# Patient Record
Sex: Female | Born: 1995 | Race: White | Hispanic: No | Marital: Single | Smoking: Current every day smoker
Health system: Southern US, Community
[De-identification: ages and names within clinical notes are randomized; demographics above are authoritative.]

## PROBLEM LIST (undated history)

## (undated) HISTORY — PX: ADENOIDECTOMY: SUR15

---
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
## 2014-01-03 ENCOUNTER — Emergency Department (INDEPENDENT_AMBULATORY_CARE_PROVIDER_SITE_OTHER)
Admission: EM | Admit: 2014-01-03 | Discharge: 2014-01-03 | Disposition: A | Discharge status: Home / Self Care | Payer: Self-pay | Source: Home / Self Care | Attending: Family Medicine | Admitting: Family Medicine

## 2014-01-03 ENCOUNTER — Encounter (HOSPITAL_COMMUNITY): Payer: Self-pay | Admitting: Emergency Medicine

## 2014-01-03 DIAGNOSIS — L03011 Cellulitis of right finger: Secondary | ICD-10-CM

## 2014-01-03 DIAGNOSIS — IMO0002 Reserved for concepts with insufficient information to code with codable children: Secondary | ICD-10-CM

## 2014-01-03 MED ORDER — CEPHALEXIN 500 MG PO CAPS: 1000.0000 mg | ORAL_CAPSULE | Freq: Three times a day (TID) | ORAL | Status: DC

## 2014-01-03 NOTE — Discharge Instructions (Signed)

## 2014-01-03 NOTE — ED Provider Notes (Signed)
CSN: [id number]     Arrival date & time 01/03/14  1422 History   First MD Initiated Contact with Patient 01/03/14 1515     Chief Complaint  Patient presents with  . Hand Pain   (Consider location/radiation/quality/duration/timing/severity/associated sxs/prior Treatment) HPI Comments: 18 year old female presents for evaluation of pain and swelling with redness of her right middle finger for the past 3 days. This started on the lateral nail fold and the redness has spread around her finger. She tried soaking it but that did not help. She denies any drainage from the finger. The pain is rated as 6 or 7/10 at this time. She has never had this before. No injury to the finger. No systemic symptoms. She does chew on her fingernails.  Patient is a 18 y.o. female presenting with hand pain.  Hand Pain    History reviewed. No pertinent past medical history. History reviewed. No pertinent past surgical history. No family history on file. History  Substance Use Topics  . Smoking status: Current Every Day Smoker -- 1.00 packs/day    Types: Cigarettes  . Smokeless tobacco: Not on file  . Alcohol Use: No   OB History   Grav Para Term Preterm Abortions TAB SAB Ect Mult Living                 Review of Systems  Musculoskeletal:       See history of present illness  All other systems reviewed and are negative.   Allergies  Review of patient's allergies indicates no known allergies.  Home Medications   Prior to Admission medications   Medication Sig Start Date End Date Taking? Authorizing Provider  cephALEXin (KEFLEX) 500 MG capsule Take 2 capsules (1,000 mg total) by mouth 3 (three) times daily. 01/03/14   Adrian BlackwaterZachary H Ajeet Casasola, PA-C   BP 117/80  Pulse 87  Temp(Src) 99.2 F (37.3 C) (Oral)  Resp 18  SpO2 97% Physical Exam  Nursing note and vitals reviewed. Constitutional: She is oriented to person, place, and time. Vital signs are normal. She appears well-developed and well-nourished. No  distress.  HENT:  Head: Normocephalic and atraumatic.  Pulmonary/Chest: Effort normal. No respiratory distress.  Musculoskeletal:       Hands: Neurological: She is alert and oriented to person, place, and time. She has normal strength. Coordination normal.  Skin: Skin is warm and dry. No rash noted. She is not diaphoretic.  Psychiatric: She has a normal mood and affect. Judgment normal.    ED Course  Procedures (including critical care time) Labs Review Labs Reviewed  CULTURE, ROUTINE-ABSCESS    Imaging Review No results found.   MDM   1. Paronychia, right    Skin cleansed with alcohol, paronychia incised and drained with an 18-gauge single. Abscess culture obtained. Mild cellulitis present so treat with keflex. Warm soaks 3 times a day until this is better. Followup if no improvement.  Meds ordered this encounter  Medications  . cephALEXin (KEFLEX) 500 MG capsule    Sig: Take 2 capsules (1,000 mg total) by mouth 3 (three) times daily.    Dispense:  42 capsule    Refill:  0    Order Specific Question:  Supervising Provider    Answer:  Clementeen GrahamOREY, EVAN, S [[id number]]       Graylon GoodZachary H Elizabet Schweppe, PA-C 01/03/14 [address]1535  Marilene Vath H MaricaoBaker, PA-C 01/03/14 1536

## 2014-01-03 NOTE — ED Notes (Signed)
Pt c/o right middle finger pain around the nail bed x 3 days Sx include swelling, tender, localized fever, redness Denies inj/trauma Alert w/no signs of acute distress.

## 2014-01-04 NOTE — ED Provider Notes (Signed)
Medical screening examination/treatment/procedure(s) were performed by a resident physician or non-physician practitioner and as the supervising physician I was immediately available for consultation/collaboration.  Xiong Haidar, MD    Jenice Leiner S Nakeda Lebron, MD 01/04/14 0744 

## 2014-01-07 LAB — CULTURE, ROUTINE-ABSCESS

## 2014-03-19 ENCOUNTER — Emergency Department (HOSPITAL_COMMUNITY)
Admission: EM | Admit: 2014-03-19 | Discharge: 2014-03-19 | Disposition: A | Discharge status: Home / Self Care | Payer: Self-pay | Attending: Emergency Medicine | Admitting: Emergency Medicine

## 2014-03-19 ENCOUNTER — Encounter (HOSPITAL_COMMUNITY): Payer: Self-pay | Admitting: Emergency Medicine

## 2014-03-19 DIAGNOSIS — Z720 Tobacco use: Secondary | ICD-10-CM | POA: Insufficient documentation

## 2014-03-19 DIAGNOSIS — N12 Tubulo-interstitial nephritis, not specified as acute or chronic: Secondary | ICD-10-CM

## 2014-03-19 DIAGNOSIS — R112 Nausea with vomiting, unspecified: Secondary | ICD-10-CM

## 2014-03-19 DIAGNOSIS — Z3202 Encounter for pregnancy test, result negative: Secondary | ICD-10-CM | POA: Insufficient documentation

## 2014-03-19 DIAGNOSIS — B379 Candidiasis, unspecified: Secondary | ICD-10-CM

## 2014-03-19 DIAGNOSIS — R197 Diarrhea, unspecified: Secondary | ICD-10-CM

## 2014-03-19 LAB — COMPREHENSIVE METABOLIC PANEL
ALT: 11 U/L (ref 0–35)
ANION GAP: 19 — AB (ref 5–15)
AST: 16 U/L (ref 0–37)
Albumin: 5.2 g/dL (ref 3.5–5.2)
Alkaline Phosphatase: 96 U/L (ref 39–117)
BILIRUBIN TOTAL: 0.9 mg/dL (ref 0.3–1.2)
BUN: 12 mg/dL (ref 6–23)
CO2: 23 mEq/L (ref 19–32)
Calcium: 10.5 mg/dL (ref 8.4–10.5)
Chloride: 97 mEq/L (ref 96–112)
Creatinine, Ser: 0.83 mg/dL (ref 0.50–1.10)
GFR calc Af Amer: >90 mL/min (ref >90)
Glucose, Bld: 110 mg/dL — ABNORMAL HIGH (ref 70–99)
Potassium: 4 mEq/L (ref 3.7–5.3)
Sodium: 139 mEq/L (ref 137–147)
Total Protein: 9.1 g/dL — ABNORMAL HIGH (ref 6.0–8.3)

## 2014-03-19 LAB — CBC WITH DIFFERENTIAL/PLATELET
Basophils Absolute: 0 10*3/uL (ref 0.0–0.1)
Basophils Relative: 0 % (ref 0–1)
Eosinophils Absolute: 0.1 10*3/uL (ref 0.0–0.7)
Eosinophils Relative: 0 % (ref 0–5)
HEMATOCRIT: 48.1 % — AB (ref 36.0–46.0)
HEMOGLOBIN: 17.3 g/dL — AB (ref 12.0–15.0)
LYMPHS PCT: 18 % (ref 12–46)
Lymphs Abs: 3.1 10*3/uL (ref 0.7–4.0)
MCH: 30.1 pg (ref 26.0–34.0)
MCHC: 36 g/dL (ref 30.0–36.0)
MCV: 83.8 fL (ref 78.0–100.0)
MONO ABS: 1.2 10*3/uL — AB (ref 0.1–1.0)
MONOS PCT: 7 % (ref 3–12)
NEUTROS ABS: 12.3 10*3/uL — AB (ref 1.7–7.7)
Neutrophils Relative %: 75 % (ref 43–77)
Platelets: 414 10*3/uL — ABNORMAL HIGH (ref 150–400)
RBC: 5.74 MIL/uL — ABNORMAL HIGH (ref 3.87–5.11)
RDW: 13.1 % (ref 11.5–15.5)
WBC: 16.7 10*3/uL — AB (ref 4.0–10.5)

## 2014-03-19 LAB — WET PREP, GENITAL: Trich, Wet Prep: NONE SEEN

## 2014-03-19 LAB — URINE MICROSCOPIC-ADD ON

## 2014-03-19 LAB — URINALYSIS, ROUTINE W REFLEX MICROSCOPIC
Bilirubin Urine: NEGATIVE
GLUCOSE, UA: NEGATIVE mg/dL
KETONES UR: NEGATIVE mg/dL
NITRITE: NEGATIVE
PROTEIN: 100 mg/dL — AB
Specific Gravity, Urine: 1.028 (ref 1.005–1.030)
Urobilinogen, UA: 0.2 mg/dL (ref 0.0–1.0)
pH: 6.5 (ref 5.0–8.0)

## 2014-03-19 LAB — LIPASE, BLOOD: Lipase: 38 U/L (ref 11–59)

## 2014-03-19 LAB — PREGNANCY, URINE: Preg Test, Ur: NEGATIVE

## 2014-03-19 MED ORDER — CIPROFLOXACIN HCL 500 MG PO TABS
500.0000 mg | ORAL_TABLET | Freq: Once | ORAL | Status: AC
  Administered 2014-03-19: 500 mg via ORAL
  Filled 2014-03-19: qty 1

## 2014-03-19 MED ORDER — CIPROFLOXACIN HCL 500 MG PO TABS: 500.0000 mg | ORAL_TABLET | Freq: Two times a day (BID) | ORAL | Status: DC

## 2014-03-19 MED ORDER — ONDANSETRON 4 MG PO TBDP
4.0000 mg | ORAL_TABLET | Freq: Once | ORAL | Status: AC
  Administered 2014-03-19: 4 mg via ORAL
  Filled 2014-03-19: qty 1

## 2014-03-19 MED ORDER — SODIUM CHLORIDE 0.9 % IV BOLUS (SEPSIS)
1000.0000 mL | Freq: Once | INTRAVENOUS | Status: AC
  Administered 2014-03-19: 1000 mL via INTRAVENOUS

## 2014-03-19 MED ORDER — ONDANSETRON 4 MG PO TBDP
8.0000 mg | ORAL_TABLET | Freq: Once | ORAL | Status: AC
Start: 2014-03-19 — End: 2014-03-19
  Administered 2014-03-19: 8 mg via ORAL
  Filled 2014-03-19: qty 2

## 2014-03-19 MED ORDER — PROMETHAZINE HCL 25 MG RE SUPP: 25.0000 mg | Freq: Four times a day (QID) | RECTAL | Status: DC | PRN

## 2014-03-19 MED ORDER — FLUCONAZOLE 100 MG PO TABS
150.0000 mg | ORAL_TABLET | Freq: Once | ORAL | Status: AC
  Administered 2014-03-19: 150 mg via ORAL
  Filled 2014-03-19: qty 2

## 2014-03-19 MED ORDER — METRONIDAZOLE 500 MG PO TABS
500.0000 mg | ORAL_TABLET | Freq: Once | ORAL | Status: AC
  Administered 2014-03-19: 500 mg via ORAL
  Filled 2014-03-19: qty 1

## 2014-03-19 MED ORDER — METRONIDAZOLE 500 MG PO TABS: 500.0000 mg | ORAL_TABLET | Freq: Two times a day (BID) | ORAL | Status: DC

## 2014-03-19 MED ORDER — FENTANYL CITRATE 0.05 MG/ML IJ SOLN
50.0000 ug | Freq: Once | INTRAMUSCULAR | Status: AC
  Administered 2014-03-19: 50 ug via INTRAVENOUS
  Filled 2014-03-19: qty 2

## 2014-03-19 MED ORDER — PROMETHAZINE HCL 25 MG PO TABS: 25.0000 mg | ORAL_TABLET | Freq: Four times a day (QID) | ORAL | Status: DC | PRN

## 2014-03-19 NOTE — ED Notes (Signed)
Pt. reports persistent emesis with diarrhea, body aches , low grade fever and chills for several days .

## 2014-03-19 NOTE — ED Provider Notes (Signed)
CSN: [id number]     Arrival date & time 03/19/14  0240 History   First MD Initiated Contact with Patient 03/19/14 0413     Chief Complaint  Patient presents with  . Emesis  . Diarrhea  . Dehydration     (Consider location/radiation/quality/duration/timing/severity/associated sxs/prior Treatment) HPI Comments: Pt comes in with cc of 4 days of feeling ill. Pt has no medical hx, no surgical hx. Pt has lower quadrant pain, and is described as sharp pain, and intermittent. + n/v/diarrhea. Emesis x 10+,  Now bilious. + loose and watery BM x 4 times a day for the past 4 days as well. Pt also having myalgias, and 100.1 tmax at home. No sick contacts. Pt did have left over chicken few days ago, which was suspicious. No hx of abd surgeries. PT has some tingling with urination, no hematuria, polyuria. Pt has no hx of STD, no risk factors for the same and there is no hx of pelvic disorders.  Patient is a 18 y.o. female presenting with vomiting and diarrhea. The history is provided by the patient.  Emesis Associated symptoms: diarrhea   Associated symptoms: no abdominal pain and no headaches   Diarrhea Associated symptoms: fever and vomiting   Associated symptoms: no abdominal pain and no headaches     History reviewed. No pertinent past medical history. Past Surgical History  Procedure Laterality Date  . Adenoidectomy     No family history on file. History  Substance Use Topics  . Smoking status: Current Every Day Smoker -- 1.00 packs/day    Types: Cigarettes  . Smokeless tobacco: Not on file  . Alcohol Use: No   OB History   Grav Para Term Preterm Abortions TAB SAB Ect Mult Living                 Review of Systems  Constitutional: Positive for fever and activity change.  Respiratory: Negative for shortness of breath.   Cardiovascular: Negative for chest pain.  Gastrointestinal: Positive for nausea, vomiting and diarrhea. Negative for abdominal pain.  Genitourinary: Positive for  dysuria. Negative for frequency, hematuria, flank pain and vaginal discharge.  Musculoskeletal: Negative for neck pain.  Neurological: Negative for light-headedness and headaches.  All other systems reviewed and are negative.     Allergies  Review of patient's allergies indicates no known allergies.  Home Medications   Prior to Admission medications   Medication Sig Start Date End Date Taking? Authorizing Provider  acetaminophen (TYLENOL) 325 MG tablet Take 650 mg by mouth every 6 (six) hours as needed for fever.   Yes Historical Provider, MD   BP 114/66  Pulse 92  Temp(Src) 98.1 F (36.7 C) (Oral)  Resp 14  SpO2 98% Physical Exam  Nursing note and vitals reviewed. Constitutional: She is oriented to person, place, and time. She appears well-developed and well-nourished.  HENT:  Head: Normocephalic and atraumatic.  Eyes: Conjunctivae and EOM are normal. Pupils are equal, round, and reactive to light.  Neck: Normal range of motion. Neck supple.  Cardiovascular: Normal rate, regular rhythm, normal heart sounds and intact distal pulses.   No murmur heard. Pulmonary/Chest: Effort normal. No respiratory distress. She has no wheezes.  Abdominal: Soft. Bowel sounds are normal. She exhibits no distension. There is tenderness. There is no rebound and no guarding.  Mild tenderness with deep palpation - diffusely. Neg Mcburney's, neg murphy's. Tenderness is worst over the epigastrium and RLQ.  Genitourinary: Vagina normal and uterus normal.  External exam - normal,  no lesions Speculum exam: Pt has some white discharge, no blood Bimanual exam: Patient has no CMT, no adnexal tenderness or fullness and cervical os is closed  Neurological: She is alert and oriented to person, place, and time.  Skin: Skin is warm and dry.    ED Course  Procedures (including critical care time) Labs Review Labs Reviewed  WET PREP, GENITAL - Abnormal; Notable for the following:    Yeast Wet Prep HPF POC  FEW (*)    Clue Cells Wet Prep HPF POC FEW (*)    WBC, Wet Prep HPF POC TOO NUMEROUS TO COUNT (*)    All other components within normal limits  CBC WITH DIFFERENTIAL - Abnormal; Notable for the following:    WBC 16.7 (*)    RBC 5.74 (*)    Hemoglobin 17.3 (*)    HCT 48.1 (*)    Platelets 414 (*)    Neutro Abs 12.3 (*)    Monocytes Absolute 1.2 (*)    All other components within normal limits  COMPREHENSIVE METABOLIC PANEL - Abnormal; Notable for the following:    Glucose, Bld 110 (*)    Total Protein 9.1 (*)    Anion gap 19 (*)    All other components within normal limits  URINALYSIS, ROUTINE W REFLEX MICROSCOPIC - Abnormal; Notable for the following:    Color, Urine AMBER (*)    APPearance TURBID (*)    Hgb urine dipstick MODERATE (*)    Protein, ur 100 (*)    Leukocytes, UA LARGE (*)    All other components within normal limits  URINE MICROSCOPIC-ADD ON - Abnormal; Notable for the following:    Squamous Epithelial / LPF FEW (*)    Bacteria, UA FEW (*)    All other components within normal limits  GC/CHLAMYDIA PROBE AMP  PREGNANCY, URINE  LIPASE, BLOOD    Imaging Review No results found.   EKG Interpretation None      @6  am: Pt still has no focal RLQ tenderness, no Mcburney's. @7  am: Still no peritoneal signs. PO challenge started. Pt to get cipro and flagyl now.  MDM   Final diagnoses:  None    Pt comes in with 4 day hx of abd pain, nausea, emesis, diarrhea. Subjective fevers at home. Some dysuria. Pt has leukocytosis, UA has WBCS and GU exam is benign.  DDx includes: Pancreatitis Hepatobiliary pathology including cholecystitis Gastritis/PUD SBO Colitis Appendicitis Pyelonephritis UTI/Cystitis Ovarian cyst Ectopic pregnancy PID STD  Based on the hx and results in front of us, i would like to r/o appendicitis, and pelvic infection. No indication for imaging currently. Pt really has intermittent pain, and has no tenderness, unless i really palpate  deep into the peritoneum. We will get serial abd exam to assess for early appendicitis.  7:07 AM Serial abd exam neg for worsening abd pain, or peritonitis or focal findings. + Yeast. Oral challenge started. Return precautions for both cholelithiasis and appendicitis discussed. Pt also advised to return to the ER in 24 hours for repeat exam. Pt to come back if she is unable to keep meds down. Pt doesn't have insurance, asked her if she would like to stay back for case manager, however, she has been here all night, and wants to go home. She will be able to afford the antibiotics.  Derwood KaplanAnkit Aslan Himes, MD 03/19/14 [phone]

## 2014-03-19 NOTE — ED Notes (Signed)
Dr. Nanavanti at the bedside.  

## 2014-03-19 NOTE — ED Notes (Signed)
Ginger ale provided also for PO challenge.

## 2014-03-19 NOTE — ED Notes (Signed)
Given water for PO challenge 

## 2014-03-19 NOTE — Discharge Instructions (Signed)
We saw you in the ER for the nausea, vomiting, diarrhea, pain. As we discussed, abdominal exam is not suggestive of any surgical problems, however, if your symptoms worsen, we want you to come back to the ER immediately.  Read the instruction below. Come back to the ER in 24 hours for repeat exam.  TAKE THE ANTIBIOTICS as prescribed, and return to the ER if you unable to keep meds down.   Abdominal Pain, Women Abdominal (stomach, pelvic, or belly) pain can be caused by many things. It is important to tell your doctor:  The location of the pain.  Does it come and go or is it present all the time?  Are there things that start the pain (eating certain foods, exercise)?  Are there other symptoms associated with the pain (fever, nausea, vomiting, diarrhea)? All of this is helpful to know when trying to find the cause of the pain. CAUSES   Stomach: virus or bacteria infection, or ulcer.  Intestine: appendicitis (inflamed appendix), regional ileitis (Crohn's disease), ulcerative colitis (inflamed colon), irritable bowel syndrome, diverticulitis (inflamed diverticulum of the colon), or cancer of the stomach or intestine.  Gallbladder disease or stones in the gallbladder.  Kidney disease, kidney stones, or infection.  Pancreas infection or cancer.  Fibromyalgia (pain disorder).  Diseases of the female organs:  Uterus: fibroid (non-cancerous) tumors or infection.  Fallopian tubes: infection or tubal pregnancy.  Ovary: cysts or tumors.  Pelvic adhesions (scar tissue).  Endometriosis (uterus lining tissue growing in the pelvis and on the pelvic organs).  Pelvic congestion syndrome (female organs filling up with blood just before the menstrual period).  Pain with the menstrual period.  Pain with ovulation (producing an egg).  Pain with an IUD (intrauterine device, birth control) in the uterus.  Cancer of the female organs.  Functional pain (pain not caused by a disease, may  improve without treatment).  Psychological pain.  Depression. DIAGNOSIS  Your doctor will decide the seriousness of your pain by doing an examination.  Blood tests.  X-rays.  Ultrasound.  CT scan (computed tomography, special type of X-ray).  MRI (magnetic resonance imaging).  Cultures, for infection.  Barium enema (dye inserted in the large intestine, to better view it with X-rays).  Colonoscopy (looking in intestine with a lighted tube).  Laparoscopy (minor surgery, looking in abdomen with a lighted tube).  Major abdominal exploratory surgery (looking in abdomen with a large incision). TREATMENT  The treatment will depend on the cause of the pain.   Many cases can be observed and treated at home.  Over-the-counter medicines recommended by your caregiver.  Prescription medicine.  Antibiotics, for infection.  Birth control pills, for painful periods or for ovulation pain.  Hormone treatment, for endometriosis.  Nerve blocking injections.  Physical therapy.  Antidepressants.  Counseling with a psychologist or psychiatrist.  Minor or major surgery. HOME CARE INSTRUCTIONS   Do not take laxatives, unless directed by your caregiver.  Take over-the-counter pain medicine only if ordered by your caregiver. Do not take aspirin because it can cause an upset stomach or bleeding.  Try a clear liquid diet (broth or water) as ordered by your caregiver. Slowly move to a bland diet, as tolerated, if the pain is related to the stomach or intestine.  Have a thermometer and take your temperature several times a day, and record it.  Bed rest and sleep, if it helps the pain.  Avoid sexual intercourse, if it causes pain.  Avoid stressful situations.  Keep your  follow-up appointments and tests, as your caregiver orders.  If the pain does not go away with medicine or surgery, you may try:  Acupuncture.  Relaxation exercises (yoga, meditation).  Group  therapy.  Counseling. SEEK MEDICAL CARE IF:   You notice certain foods cause stomach pain.  Your home care treatment is not helping your pain.  You need stronger pain medicine.  You want your IUD removed.  You feel faint or lightheaded.  You develop nausea and vomiting.  You develop a rash.  You are having side effects or an allergy to your medicine. SEEK IMMEDIATE MEDICAL CARE IF:   Your pain does not go away or gets worse.  You have a fever.  Your pain is felt only in portions of the abdomen. The right side could possibly be appendicitis. The left lower portion of the abdomen could be colitis or diverticulitis.  You are passing blood in your stools (bright red or black tarry stools, with or without vomiting).  You have blood in your urine.  You develop chills, with or without a fever.  You pass out. MAKE SURE YOU:   Understand these instructions.  Will watch your condition.  Will get help right away if you are not doing well or get worse. Document Released: 03/21/2007 Document Revised: 10/08/2013 Document Reviewed: 04/10/2009 [facility] Patient Information 2015 [place], [place]. This information is not intended to replace advice given to you by your health care provider. Make sure you discuss any questions you have with your health care provider.   Appendicitis Appendicitis is when the appendix is swollen (inflamed). The inflammation can lead to developing a hole (perforation) and a collection of pus (abscess). CAUSES  There is not always an obvious cause of appendicitis. Sometimes it is caused by an obstruction in the appendix. The obstruction can be caused by:  A small, hard, pea-sized ball of stool (fecalith).  Enlarged lymph glands in the appendix. SYMPTOMS   Pain around your belly button (navel) that moves toward your lower right belly (abdomen). The pain can become more severe and sharp as time passes.  Tenderness in the lower right abdomen. Pain gets  worse if you cough or make a sudden movement.  Feeling sick to your stomach (nauseous).  Throwing up (vomiting).  Loss of appetite.  Fever.  Constipation.  Diarrhea.  Generally not feeling well. DIAGNOSIS   Physical exam.  Blood tests.  Urine test.  X-rays or a CT scan may confirm the diagnosis. TREATMENT  Once the diagnosis of appendicitis is made, the most common treatment is to remove the appendix as soon as possible. This procedure is called appendectomy. In an open appendectomy, a cut (incision) is made in the lower right abdomen and the appendix is removed. In a laparoscopic appendectomy, usually 3 small incisions are made. Long, thin instruments and a camera tube are used to remove the appendix. Most patients go home in 24 to 48 hours after appendectomy. In some situations, the appendix may have already perforated and an abscess may have formed. The abscess may have a "wall" around it as seen on a CT scan. In this case, a drain may be placed into the abscess to remove fluid, and you may be treated with antibiotic medicines that kill germs. The medicine is given through a tube in your vein (IV). Once the abscess has resolved, it may or may not be necessary to have an appendectomy. You may need to stay in the hospital longer than 48 hours. Document Released: 05/24/2005  Document Revised: 11/23/2011 Document Reviewed: 08/19/2009 [facility] Patient Information 2015 [place], [place]. This information is not intended to replace advice given to you by your health care provider. Make sure you discuss any questions you have with your health care provider. Cholelithiasis Cholelithiasis (also called gallstones) is a form of gallbladder disease in which gallstones form in your gallbladder. The gallbladder is an organ that stores bile made in the liver, which helps digest fats. Gallstones begin as small crystals and slowly grow into stones. Gallstone pain occurs when the gallbladder spasms and a  gallstone is blocking the duct. Pain can also occur when a stone passes out of the duct.  RISK FACTORS  Being female.   Having multiple pregnancies. Health care providers sometimes advise removing diseased gallbladders before future pregnancies.   Being obese.  Eating a diet heavy in fried foods and fat.   Being older than 60 years and increasing age.   Prolonged use of medicines containing female hormones.   Having diabetes mellitus.   Rapidly losing weight.   Having a family history of gallstones (heredity).  SYMPTOMS  Nausea.   Vomiting.  Abdominal pain.   Yellowing of the skin (jaundice).   Sudden pain. It may persist from several minutes to several hours.  Fever.   Tenderness to the touch. In some cases, when gallstones do not move into the bile duct, people have no pain or symptoms. These are called "silent" gallstones.  TREATMENT Silent gallstones do not need treatment. In severe cases, emergency surgery may be required. Options for treatment include:  Surgery to remove the gallbladder. This is the most common treatment.  Medicines. These do not always work and may take 6-12 months or more to work.  Shock wave treatment (extracorporeal biliary lithotripsy). In this treatment an ultrasound machine sends shock waves to the gallbladder to break gallstones into smaller pieces that can pass into the intestines or be dissolved by medicine. HOME CARE INSTRUCTIONS   Only take over-the-counter or prescription medicines for pain, discomfort, or fever as directed by your health care provider.   Follow a low-fat diet until seen again by your health care provider. Fat causes the gallbladder to contract, which can result in pain.   Follow up with your health care provider as directed. Attacks are almost always recurrent and surgery is usually required for permanent treatment.  SEEK IMMEDIATE MEDICAL CARE IF:   Your pain increases and is not controlled by  medicines.   You have a fever or persistent symptoms for more than 2-3 days.   You have a fever and your symptoms suddenly get worse.   You have persistent nausea and vomiting.  MAKE SURE YOU:   Understand these instructions.  Will watch your condition.  Will get help right away if you are not doing well or get worse. Document Released: 05/20/2005 Document Revised: 01/24/2013 Document Reviewed: 11/15/2012 [facility] Patient Information 2015 [place], [place]. This information is not intended to replace advice given to you by your health care provider. Make sure you discuss any questions you have with your health care provider.

## 2014-03-19 NOTE — ED Notes (Signed)
Pelvic cart is at the bedside. Patient given mouth swabs for comfort.

## 2014-03-19 NOTE — ED Notes (Signed)
Called main lab, wet prep takes 15 minutes to result.  Called Dr. Shyrl NumbersNanavanti to report this and that patient is requesting nausea medication. MD acknowledges and will enter medication.

## 2014-03-20 LAB — GC/CHLAMYDIA PROBE AMP
CT PROBE, AMP APTIMA: NEGATIVE
GC PROBE AMP APTIMA: NEGATIVE

## 2014-03-21 ENCOUNTER — Encounter (HOSPITAL_COMMUNITY): Payer: Self-pay | Admitting: Emergency Medicine

## 2014-03-21 ENCOUNTER — Emergency Department (HOSPITAL_COMMUNITY)
Admission: EM | Admit: 2014-03-21 | Discharge: 2014-03-21 | Disposition: A | Discharge status: Home / Self Care | Payer: Self-pay | Attending: Emergency Medicine | Admitting: Emergency Medicine

## 2014-03-21 ENCOUNTER — Emergency Department (HOSPITAL_COMMUNITY)
Admission: EM | Admit: 2014-03-21 | Discharge: 2014-03-21 | Discharge status: Against Medical Advice | Payer: Self-pay | Attending: Emergency Medicine | Admitting: Emergency Medicine

## 2014-03-21 DIAGNOSIS — Z792 Long term (current) use of antibiotics: Secondary | ICD-10-CM | POA: Insufficient documentation

## 2014-03-21 DIAGNOSIS — Z720 Tobacco use: Secondary | ICD-10-CM | POA: Insufficient documentation

## 2014-03-21 DIAGNOSIS — R630 Anorexia: Secondary | ICD-10-CM | POA: Insufficient documentation

## 2014-03-21 DIAGNOSIS — R1110 Vomiting, unspecified: Secondary | ICD-10-CM | POA: Insufficient documentation

## 2014-03-21 DIAGNOSIS — Z3202 Encounter for pregnancy test, result negative: Secondary | ICD-10-CM | POA: Insufficient documentation

## 2014-03-21 DIAGNOSIS — R5383 Other fatigue: Secondary | ICD-10-CM | POA: Insufficient documentation

## 2014-03-21 DIAGNOSIS — M545 Low back pain: Secondary | ICD-10-CM | POA: Insufficient documentation

## 2014-03-21 DIAGNOSIS — R112 Nausea with vomiting, unspecified: Secondary | ICD-10-CM | POA: Insufficient documentation

## 2014-03-21 DIAGNOSIS — R109 Unspecified abdominal pain: Secondary | ICD-10-CM | POA: Insufficient documentation

## 2014-03-21 LAB — COMPREHENSIVE METABOLIC PANEL
ALT: 10 U/L (ref 0–35)
AST: 13 U/L (ref 0–37)
Albumin: 4.9 g/dL (ref 3.5–5.2)
Alkaline Phosphatase: 81 U/L (ref 39–117)
Anion gap: 18 — ABNORMAL HIGH (ref 5–15)
BUN: 11 mg/dL (ref 6–23)
CALCIUM: 10.2 mg/dL (ref 8.4–10.5)
CO2: 22 meq/L (ref 19–32)
CREATININE: 0.77 mg/dL (ref 0.50–1.10)
Chloride: 101 mEq/L (ref 96–112)
Glucose, Bld: 87 mg/dL (ref 70–99)
Potassium: 4.1 mEq/L (ref 3.7–5.3)
Sodium: 141 mEq/L (ref 137–147)
Total Bilirubin: 0.8 mg/dL (ref 0.3–1.2)
Total Protein: 8.5 g/dL — ABNORMAL HIGH (ref 6.0–8.3)

## 2014-03-21 LAB — URINALYSIS, ROUTINE W REFLEX MICROSCOPIC
Glucose, UA: NEGATIVE mg/dL
HGB URINE DIPSTICK: NEGATIVE
KETONES UR: 40 mg/dL — AB
Nitrite: NEGATIVE
PROTEIN: NEGATIVE mg/dL
Specific Gravity, Urine: 1.024 (ref 1.005–1.030)
UROBILINOGEN UA: 0.2 mg/dL (ref 0.0–1.0)
pH: 6 (ref 5.0–8.0)

## 2014-03-21 LAB — CBC WITH DIFFERENTIAL/PLATELET
BASOS PCT: 0 % (ref 0–1)
Basophils Absolute: 0 10*3/uL (ref 0.0–0.1)
EOS ABS: 0 10*3/uL (ref 0.0–0.7)
EOS PCT: 0 % (ref 0–5)
HCT: 45.6 % (ref 36.0–46.0)
HEMOGLOBIN: 16.1 g/dL — AB (ref 12.0–15.0)
Lymphocytes Relative: 28 % (ref 12–46)
Lymphs Abs: 2.7 10*3/uL (ref 0.7–4.0)
MCH: 29.8 pg (ref 26.0–34.0)
MCHC: 35.3 g/dL (ref 30.0–36.0)
MCV: 84.4 fL (ref 78.0–100.0)
MONOS PCT: 6 % (ref 3–12)
Monocytes Absolute: 0.6 10*3/uL (ref 0.1–1.0)
NEUTROS PCT: 66 % (ref 43–77)
Neutro Abs: 6.2 10*3/uL (ref 1.7–7.7)
Platelets: 340 10*3/uL (ref 150–400)
RBC: 5.4 MIL/uL — ABNORMAL HIGH (ref 3.87–5.11)
RDW: 13 % (ref 11.5–15.5)
WBC: 9.5 10*3/uL (ref 4.0–10.5)

## 2014-03-21 LAB — URINE MICROSCOPIC-ADD ON

## 2014-03-21 LAB — PREGNANCY, URINE: Preg Test, Ur: NEGATIVE

## 2014-03-21 LAB — LIPASE, BLOOD: LIPASE: 24 U/L (ref 11–59)

## 2014-03-21 MED ORDER — ONDANSETRON 8 MG PO TBDP
8.0000 mg | ORAL_TABLET | Freq: Once | ORAL | Status: AC
  Administered 2014-03-21: 8 mg via ORAL
  Filled 2014-03-21: qty 1

## 2014-03-21 MED ORDER — SODIUM CHLORIDE 0.9 % IV BOLUS (SEPSIS)
1000.0000 mL | Freq: Once | INTRAVENOUS | Status: AC
  Administered 2014-03-21: 1000 mL via INTRAVENOUS

## 2014-03-21 MED ORDER — SODIUM CHLORIDE 0.9 % IV BOLUS (SEPSIS): 1000.0000 mL | Freq: Once | INTRAVENOUS | Status: DC

## 2014-03-21 MED ORDER — ONDANSETRON HCL 4 MG/2ML IJ SOLN
4.0000 mg | Freq: Once | INTRAMUSCULAR | Status: AC
  Administered 2014-03-21: 4 mg via INTRAVENOUS
  Filled 2014-03-21: qty 2

## 2014-03-21 MED ORDER — ONDANSETRON 8 MG PO TBDP: 8.0000 mg | ORAL_TABLET | Freq: Three times a day (TID) | ORAL | Status: DC | PRN

## 2014-03-21 NOTE — ED Notes (Signed)
Patient states was here 2 days ago for same.  She states "the doctor just pressed on my stomach and sent me home with antibiotics".  Patient states that she was also told "it was gallbladder or appendicitis.  Sent me home with stuff to look for".   Patient claims that her birthcontrol implant is expired and that could be what is making her so sick.   Patient states there is a possibility (low) that she could be pregnant.

## 2014-03-21 NOTE — ED Provider Notes (Signed)
CSN: [id number]     Arrival date & time 03/21/14  1803 History   First MD Initiated Contact with Patient 03/21/14 2033     Chief Complaint  Patient presents with  . Nausea  . Emesis  . Back Pain     (Consider location/radiation/quality/duration/timing/severity/associated sxs/prior Treatment) Patient is a 18 y.o. female presenting with vomiting and back pain. The history is provided by the patient.  Emesis Severity:  Moderate Associated symptoms: no abdominal pain, no diarrhea and no headaches   Back Pain Associated symptoms: no abdominal pain, no chest pain, no headaches, no numbness and no weakness    patient has had nausea and vomiting for the last 5-6 days. She was seen in the ER for the same 2 days ago. She had some chills. She ate some chicken that she was not sure was still good previously. She initially had a little loose stool, but that has resolved. She has some lower back pain. She had some chills with that also was called. She's had a decreased appetite. She states that she was given antibiotics on the last visit. She states Phenergan has not helped. She has had oral and rectal Phenergan  History reviewed. No pertinent past medical history. Past Surgical History  Procedure Laterality Date  . Adenoidectomy     No family history on file. History  Substance Use Topics  . Smoking status: Current Every Day Smoker -- 1.00 packs/day    Types: Cigarettes  . Smokeless tobacco: Not on file  . Alcohol Use: Yes     Comment: socially   OB History   Grav Para Term Preterm Abortions TAB SAB Ect Mult Living                 Review of Systems  Constitutional: Positive for appetite change and fatigue. Negative for activity change.  Eyes: Negative for pain.  Respiratory: Negative for chest tightness and shortness of breath.   Cardiovascular: Negative for chest pain and leg swelling.  Gastrointestinal: Positive for nausea and vomiting. Negative for abdominal pain and diarrhea.   Genitourinary: Negative for flank pain.  Musculoskeletal: Positive for back pain. Negative for neck stiffness.  Skin: Negative for rash.  Neurological: Negative for weakness, numbness and headaches.  Psychiatric/Behavioral: Negative for behavioral problems.      Allergies  Food  Home Medications   Prior to Admission medications   Medication Sig Start Date End Date Taking? Authorizing Provider  ciprofloxacin (CIPRO) 500 MG tablet Take 1 tablet (500 mg total) by mouth 2 (two) times daily. 03/19/14  Yes Derwood KaplanAnkit Nanavati, MD  metroNIDAZOLE (FLAGYL) 500 MG tablet Take 1 tablet (500 mg total) by mouth 2 (two) times daily. 03/19/14  Yes Derwood KaplanAnkit Nanavati, MD  Multiple Vitamin (MULTIVITAMIN WITH MINERALS) TABS tablet Take 1 tablet by mouth daily.   Yes Historical Provider, MD  promethazine (PHENERGAN) 25 MG suppository Place 1 suppository (25 mg total) rectally every 6 (six) hours as needed for nausea. 03/19/14  Yes Derwood KaplanAnkit Nanavati, MD  promethazine (PHENERGAN) 25 MG tablet Take 1 tablet (25 mg total) by mouth every 6 (six) hours as needed for nausea. 03/19/14  Yes Ankit Rhunette CroftNanavati, MD  ondansetron (ZOFRAN-ODT) 8 MG disintegrating tablet Take 1 tablet (8 mg total) by mouth every 8 (eight) hours as needed for nausea or vomiting. 03/21/14   Juliet RudeNathan R. Jarissa Sheriff, MD   Pulse 106  Temp(Src) 98.5 F (36.9 C) (Oral)  Resp 20  SpO2 100% Physical Exam  Constitutional: She is oriented to person, place,  and time. She appears well-developed and well-nourished.  HENT:  Head: Normocephalic and atraumatic.  Cardiovascular: Normal rate and regular rhythm.   Pulmonary/Chest: Effort normal and breath sounds normal.  Abdominal: Soft. Bowel sounds are normal. There is no tenderness. There is no rebound and no guarding.  Musculoskeletal: She exhibits tenderness.  Mild lower back tenderness without step-off or deformity. No rash.  Neurological: She is alert and oriented to person, place, and time.  Skin: Skin is  warm.  Psychiatric: She has a normal mood and affect.    ED Course  Procedures (including critical care time) Labs Review Labs Reviewed  CBC WITH DIFFERENTIAL - Abnormal; Notable for the following:    RBC 5.40 (*)    Hemoglobin 16.1 (*)    All other components within normal limits  URINALYSIS, ROUTINE W REFLEX MICROSCOPIC - Abnormal; Notable for the following:    APPearance CLOUDY (*)    Bilirubin Urine SMALL (*)    Ketones, ur 40 (*)    Leukocytes, UA SMALL (*)    All other components within normal limits  COMPREHENSIVE METABOLIC PANEL - Abnormal; Notable for the following:    Total Protein 8.5 (*)    Anion gap 18 (*)    All other components within normal limits  URINE MICROSCOPIC-ADD ON - Abnormal; Notable for the following:    Squamous Epithelial / LPF MANY (*)    All other components within normal limits  URINE CULTURE  PREGNANCY, URINE  LIPASE, BLOOD    Imaging Review No results found.   EKG Interpretation None      MDM   Final diagnoses:  Non-intractable vomiting with nausea, vomiting of unspecified type     patient with nausea vomiting. Could be due to food. No urinary tract infection now and culture was sent due to possible infection before. Previous records reviewed. She does have 40 ketones in the urine. Feels better after IV fluids and Zofran. She's tolerated orals and is eager to be discharged home.     Juliet RudeNathan R. Rubin PayorPickering, MD 03/21/14 2318

## 2014-03-21 NOTE — Discharge Instructions (Signed)

## 2014-03-21 NOTE — ED Notes (Signed)
PT provided with Sprite

## 2014-03-21 NOTE — ED Notes (Signed)
Pt states that she was at [facility] today for 6 hours and was not seen.  Was at [facility] 2 days before that for same.  States she has been vomiting x 1 wk w/ low back pain.

## 2014-03-22 LAB — URINE CULTURE
Colony Count: NO GROWTH
Culture: NO GROWTH

## 2016-07-07 ENCOUNTER — Encounter (HOSPITAL_COMMUNITY): Payer: Self-pay | Admitting: Emergency Medicine

## 2016-07-07 DIAGNOSIS — F17210 Nicotine dependence, cigarettes, uncomplicated: Secondary | ICD-10-CM | POA: Insufficient documentation

## 2016-07-07 DIAGNOSIS — K0520 Aggressive periodontitis, unspecified: Secondary | ICD-10-CM | POA: Insufficient documentation

## 2016-07-07 DIAGNOSIS — K011 Impacted teeth: Secondary | ICD-10-CM | POA: Insufficient documentation

## 2016-07-07 NOTE — ED Triage Notes (Signed)
Pt c/o right lower jaw pain from wisdom teeth.

## 2016-07-08 ENCOUNTER — Emergency Department (HOSPITAL_COMMUNITY)
Admission: EM | Admit: 2016-07-08 | Discharge: 2016-07-08 | Disposition: A | Discharge status: Home / Self Care | Payer: Self-pay | Attending: Emergency Medicine | Admitting: Emergency Medicine

## 2016-07-08 DIAGNOSIS — K011 Impacted teeth: Secondary | ICD-10-CM

## 2016-07-08 DIAGNOSIS — K0520 Aggressive periodontitis, unspecified: Secondary | ICD-10-CM

## 2016-07-08 MED ORDER — PENICILLIN V POTASSIUM 500 MG PO TABS: 500.0000 mg | ORAL_TABLET | Freq: Four times a day (QID) | ORAL | 0 refills | Status: AC

## 2016-07-08 MED ORDER — KETOROLAC TROMETHAMINE 60 MG/2ML IM SOLN
60.0000 mg | Freq: Once | INTRAMUSCULAR | Status: AC
  Administered 2016-07-08: 60 mg via INTRAMUSCULAR
  Filled 2016-07-08: qty 2

## 2016-07-08 MED ORDER — PENICILLIN V POTASSIUM 250 MG PO TABS
500.0000 mg | ORAL_TABLET | Freq: Once | ORAL | Status: AC
  Administered 2016-07-08: 500 mg via ORAL
  Filled 2016-07-08: qty 2

## 2016-07-08 NOTE — Discharge Instructions (Signed)
Gargle with warm salt water for comfort. Take the antibiotics until gone. Take ibuprofen 600 mg + acetaminophen 1000 mg 4 times a day for pain. You will need to see a dentist to get definitive treatment of your problem.

## 2016-07-08 NOTE — ED Provider Notes (Signed)
[facility] Provider Note   CSN: [id number] Arrival date & time: 07/07/16  2318  Time seen 02:07 AM   History   Chief Complaint Chief Complaint  Patient presents with  . Dental Pain    HPI SHARIFAH CHAMPINE is a 21 y.o. female.  HPI Patient states her upper wisdom teeth started bothering her to 3 months ago. She states her lower wisdom teeth have been hurting for about the last 2 weeks. She states it got worse tonight about 10 PM especially on the right lower wisdom tooth. She states she took Tylenol without relief. She states it's making her have headaches. She denies any fever. She states she feels like when she is drinking she's not drinking as much fluid as usual. She denies any difficulty breathing. She states she doesn't have a dentist.  PCP none    History reviewed. No pertinent past medical history.  There are no active problems to display for this patient.   Past Surgical History:  Procedure Laterality Date  . ADENOIDECTOMY      OB History    No data available       Home Medications    Prior to Admission medications   Medication Sig Start Date End Date Taking? Authorizing Provider  ciprofloxacin (CIPRO) 500 MG tablet Take 1 tablet (500 mg total) by mouth 2 (two) times daily. 03/19/14   Derwood Kaplan, MD  metroNIDAZOLE (FLAGYL) 500 MG tablet Take 1 tablet (500 mg total) by mouth 2 (two) times daily. 03/19/14   Derwood Kaplan, MD  Multiple Vitamin (MULTIVITAMIN WITH MINERALS) TABS tablet Take 1 tablet by mouth daily.    Historical Provider, MD  ondansetron (ZOFRAN-ODT) 8 MG disintegrating tablet Take 1 tablet (8 mg total) by mouth every 8 (eight) hours as needed for nausea or vomiting. 03/21/14   Benjiman Core, MD  penicillin v potassium (VEETID) 500 MG tablet Take 1 tablet (500 mg total) by mouth 4 (four) times daily. 07/08/16 07/15/16  Devoria Albe, MD  promethazine (PHENERGAN) 25 MG suppository Place 1 suppository (25 mg total) rectally every 6 (six) hours  as needed for nausea. 03/19/14   Derwood Kaplan, MD  promethazine (PHENERGAN) 25 MG tablet Take 1 tablet (25 mg total) by mouth every 6 (six) hours as needed for nausea. 03/19/14   Derwood Kaplan, MD    Family History History reviewed. No pertinent family history.  Social History Social History  Substance Use Topics  . Smoking status: Current Every Day Smoker    Packs/day: 1.00    Types: Cigarettes  . Smokeless tobacco: Never Used  . Alcohol use Yes     Comment: socially  employed at [employer] 1/2 ppd   Allergies   Food   Review of Systems Review of Systems  All other systems reviewed and are negative.    Physical Exam Updated Vital Signs BP 136/93   Pulse 75   Temp 98.2 F (36.8 C)   Resp 18   Ht 5\' 1"  (1.549 m)   Wt 150 lb (68 kg)   LMP 07/05/2016   SpO2 100%   BMI 28.34 kg/m   Vital signs normal    Physical Exam  Constitutional: She is oriented to person, place, and time. She appears well-developed and well-nourished. She appears distressed.  HENT:  Head: Normocephalic and atraumatic.  Right Ear: External ear normal.  Left Ear: External ear normal.  Mouth/Throat: Oropharynx is clear and moist.  When I inspected the area of her wisdom teeth the teeth  are not visible. They are are still below the gumline. She is very tender to palpation over the gum of the right lower wisdom tooth. There is no facial swelling seen there is no soft palate swelling seen the uvula is midline. Her speech is normal.  Eyes: Conjunctivae and EOM are normal.  Neck: Normal range of motion.  Cardiovascular: Normal rate.   Pulmonary/Chest: Effort normal. No respiratory distress.  Musculoskeletal: Normal range of motion.  Neurological: She is alert and oriented to person, place, and time. No cranial nerve deficit.  Skin: Skin is warm and dry.  Psychiatric: She has a normal mood and affect. Her behavior is normal. Thought content normal.     ED Treatments / Results    Procedures Procedures (including critical care time)  Medications Ordered in ED Medications  penicillin v potassium (VEETID) tablet 500 mg (not administered)  ketorolac (TORADOL) injection 60 mg (60 mg Intramuscular Given 07/08/16 0220)     Initial Impression / Assessment and Plan / ED Course  I have reviewed the triage vital signs and the nursing notes.  Pertinent labs & imaging results that were available during my care of the patient were reviewed by me and considered in my medical decision making (see chart for details).   patient was given Toradol for pain. She was started on penicillin 500 mg 4 times a day. I suspect she has pericoronitis. We discussed she needs to see a dentist for definitive care of her dental problem. She can take acetaminophen and ibuprofen for her pain.  Final Clinical Impressions(s) / ED Diagnoses   Final diagnoses:  Acute pericoronitis  Impacted tooth    New Prescriptions New Prescriptions   PENICILLIN V POTASSIUM (VEETID) 500 MG TABLET    Take 1 tablet (500 mg total) by mouth 4 (four) times daily.    Plan discharge  Devoria AlbeIva Nehal Witting, MD, Concha PyoFACEP    Averianna Brugger, MD 07/08/16 Emeline [id number]

## 2016-11-07 ENCOUNTER — Encounter (HOSPITAL_COMMUNITY): Payer: Self-pay | Admitting: Emergency Medicine

## 2016-11-07 ENCOUNTER — Emergency Department (HOSPITAL_COMMUNITY)
Admission: EM | Admit: 2016-11-07 | Discharge: 2016-11-07 | Disposition: A | Discharge status: Home / Self Care | Payer: Self-pay | Attending: Emergency Medicine | Admitting: Emergency Medicine

## 2016-11-07 DIAGNOSIS — G44209 Tension-type headache, unspecified, not intractable: Secondary | ICD-10-CM | POA: Insufficient documentation

## 2016-11-07 DIAGNOSIS — Z79899 Other long term (current) drug therapy: Secondary | ICD-10-CM | POA: Insufficient documentation

## 2016-11-07 DIAGNOSIS — S80261A Insect bite (nonvenomous), right knee, initial encounter: Secondary | ICD-10-CM | POA: Insufficient documentation

## 2016-11-07 DIAGNOSIS — Y999 Unspecified external cause status: Secondary | ICD-10-CM | POA: Insufficient documentation

## 2016-11-07 DIAGNOSIS — Y929 Unspecified place or not applicable: Secondary | ICD-10-CM | POA: Insufficient documentation

## 2016-11-07 DIAGNOSIS — W57XXXA Bitten or stung by nonvenomous insect and other nonvenomous arthropods, initial encounter: Secondary | ICD-10-CM | POA: Insufficient documentation

## 2016-11-07 DIAGNOSIS — S80262A Insect bite (nonvenomous), left knee, initial encounter: Secondary | ICD-10-CM | POA: Insufficient documentation

## 2016-11-07 DIAGNOSIS — Y939 Activity, unspecified: Secondary | ICD-10-CM | POA: Insufficient documentation

## 2016-11-07 DIAGNOSIS — F17210 Nicotine dependence, cigarettes, uncomplicated: Secondary | ICD-10-CM | POA: Insufficient documentation

## 2016-11-07 MED ORDER — DOXYCYCLINE HYCLATE 100 MG PO CAPS: 100.0000 mg | ORAL_CAPSULE | Freq: Two times a day (BID) | ORAL | 0 refills | Status: DC

## 2016-11-07 NOTE — Discharge Instructions (Signed)
Recheck with primary care MD in 1 week.

## 2016-11-07 NOTE — ED Triage Notes (Signed)
Pt also complains of diarrhea.

## 2016-11-07 NOTE — ED Triage Notes (Signed)
Pt with tick bites to groin and behind both knees. States she has been having joint pain since then with weakness. States she started taking her boyfriends abx last night.

## 2016-11-07 NOTE — ED Provider Notes (Signed)
[facility] Provider Note   CSN: [id number] Arrival date & time: 11/07/16  1928     History   Chief Complaint Chief Complaint  Patient presents with  . tick bites    HPI Jamie Schroeder is a 21 y.o. female.  The history is provided by the patient. No language interpreter was used.  Rash   This is a new problem. The problem has not changed since onset.The problem is associated with nothing. There has been no fever. The fever has been present for 3 to 4 days. The patient is experiencing no pain. The pain has been constant since onset. She has tried nothing for the symptoms. The treatment provided no relief.  Pt reports she has removed multiple ticks. Pt reports she developed a headache.  Pt started taking an rx of doxycycline.  Pt reports she had a rash earlier.   She is concerned about lyme disease.  History reviewed. No pertinent past medical history.  There are no active problems to display for this patient.   Past Surgical History:  Procedure Laterality Date  . ADENOIDECTOMY      OB History    No data available       Home Medications    Prior to Admission medications   Medication Sig Start Date End Date Taking? Authorizing Provider  ciprofloxacin (CIPRO) 500 MG tablet Take 1 tablet (500 mg total) by mouth 2 (two) times daily. 03/19/14   Derwood Kaplan, MD  doxycycline (VIBRAMYCIN) 100 MG capsule Take 1 capsule (100 mg total) by mouth 2 (two) times daily. 11/07/16   Elson Areas, PA-C  metroNIDAZOLE (FLAGYL) 500 MG tablet Take 1 tablet (500 mg total) by mouth 2 (two) times daily. 03/19/14   Derwood Kaplan, MD  Multiple Vitamin (MULTIVITAMIN WITH MINERALS) TABS tablet Take 1 tablet by mouth daily.    [provider]  ondansetron (ZOFRAN-ODT) 8 MG disintegrating tablet Take 1 tablet (8 mg total) by mouth every 8 (eight) hours as needed for nausea or vomiting. 03/21/14   Benjiman Core, MD  promethazine (PHENERGAN) 25 MG suppository Place 1  suppository (25 mg total) rectally every 6 (six) hours as needed for nausea. 03/19/14   Derwood Kaplan, MD  promethazine (PHENERGAN) 25 MG tablet Take 1 tablet (25 mg total) by mouth every 6 (six) hours as needed for nausea. 03/19/14   Derwood Kaplan, MD    Family History No family history on file.  Social History Social History  Substance Use Topics  . Smoking status: Current Every Day Smoker    Packs/day: 1.00    Types: Cigarettes  . Smokeless tobacco: Never Used  . Alcohol use Yes     Comment: socially     Allergies   Food   Review of Systems Review of Systems  Skin: Positive for rash.  All other systems reviewed and are negative.    Physical Exam Updated Vital Signs BP 121/71 (BP Location: Left Arm)   Pulse 89   Temp 98.2 F (36.8 C) (Oral)   Resp 16   Ht 5\' 1"  (1.549 m)   Wt 63.5 kg (140 lb)   LMP 11/05/2016 (Exact Date)   SpO2 100%   BMI 26.45 kg/m   Physical Exam  Constitutional: She appears well-developed and well-nourished. No distress.  HENT:  Head: Normocephalic and atraumatic.  Eyes: Conjunctivae are normal.  Neck: Neck supple.  Cardiovascular: Normal rate and regular rhythm.   No murmur heard. Pulmonary/Chest: Effort normal and breath sounds normal. No respiratory  distress.  Abdominal: Soft. There is no tenderness.  Musculoskeletal: She exhibits no edema.  Neurological: She is alert.  Skin: Skin is warm and dry.  Psychiatric: She has a normal mood and affect.  Nursing note and vitals reviewed.    ED Treatments / Results  Labs (all labs ordered are listed, but only abnormal results are displayed) Labs Reviewed  B. BURGDORFI ANTIBODIES  ROCKY MTN SPOTTED FVR ABS PNL(IGG+IGM)    EKG  EKG Interpretation None       Radiology No results found.  Procedures Procedures (including critical care time)  Medications Ordered in ED Medications - No data to display   Initial Impression / Assessment and Plan / ED Course  I have  reviewed the triage vital signs and the nursing notes.  Pertinent labs & imaging results that were available during my care of the patient were reviewed by me and considered in my medical decision making (see chart for details).     RMSF and Lyme ordered.  Pt given rx for doxycycline. Pt advised of pending test and need to followup with primary.   Final Clinical Impressions(s) / ED Diagnoses   Final diagnoses:  Tick bite, initial encounter  Tension-type headache, not intractable, unspecified chronicity pattern    New Prescriptions New Prescriptions   DOXYCYCLINE (VIBRAMYCIN) 100 MG CAPSULE    Take 1 capsule (100 mg total) by mouth 2 (two) times daily.  An After Visit Summary was printed and given to the patient.    Osie CheeksSofia, Bennette Hasty K, PA-C 11/07/16 2130    Eber HongMiller, Brian, MD 11/09/16 [phone]

## 2016-11-09 LAB — B. BURGDORFI ANTIBODIES: B burgdorferi Ab IgG+IgM: <0.91 {ISR} (ref 0.00–0.90)

## 2016-11-10 LAB — ROCKY MTN SPOTTED FVR ABS PNL(IGG+IGM)
RMSF IGG: NEGATIVE
RMSF IGM: 0.46 {index} (ref 0.00–0.89)

## 2017-02-05 ENCOUNTER — Emergency Department (HOSPITAL_COMMUNITY): Payer: Self-pay

## 2017-02-05 ENCOUNTER — Encounter (HOSPITAL_COMMUNITY): Payer: Self-pay | Admitting: Emergency Medicine

## 2017-02-05 ENCOUNTER — Emergency Department (HOSPITAL_COMMUNITY)
Admission: EM | Admit: 2017-02-05 | Discharge: 2017-02-05 | Disposition: A | Discharge status: Home / Self Care | Payer: Self-pay | Attending: Emergency Medicine | Admitting: Emergency Medicine

## 2017-02-05 DIAGNOSIS — X503XXA Overexertion from repetitive movements, initial encounter: Secondary | ICD-10-CM | POA: Insufficient documentation

## 2017-02-05 DIAGNOSIS — M79641 Pain in right hand: Secondary | ICD-10-CM | POA: Insufficient documentation

## 2017-02-05 DIAGNOSIS — F17210 Nicotine dependence, cigarettes, uncomplicated: Secondary | ICD-10-CM | POA: Insufficient documentation

## 2017-02-05 DIAGNOSIS — M25521 Pain in right elbow: Secondary | ICD-10-CM | POA: Insufficient documentation

## 2017-02-05 MED ORDER — NAPROXEN 500 MG PO TABS: 500.0000 mg | ORAL_TABLET | Freq: Two times a day (BID) | ORAL | 0 refills | Status: DC

## 2017-02-05 NOTE — ED Triage Notes (Addendum)
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 States "I take 6 ibuprofen at a time and sleep for like 4 hours then wake back up and take 6 more." States last ibuprofen use was 1000 today.

## 2017-02-05 NOTE — ED Provider Notes (Signed)
[facility] Provider Note   CSN: [id number] Arrival date & time: 02/05/17  1249     History   Chief Complaint Chief Complaint  Patient presents with  . Hand Pain    HPI Jamie Schroeder is a 21 y.o. female.  Patient complains of pain in her right hand.  she does repetitive motion at work. Patient also has contused her right elbow.     Hand Pain  This is a new problem. The current episode started more than 2 days ago. The problem occurs constantly. The problem has not changed since onset.Pertinent negatives include no chest pain, no abdominal pain and no headaches. Exacerbated by: Movement. Nothing relieves the symptoms.    History reviewed. No pertinent past medical history.  There are no active problems to display for this patient.   Past Surgical History:  Procedure Laterality Date  . ADENOIDECTOMY      OB History    No data available       Home Medications    Prior to Admission medications   Medication Sig Start Date End Date Taking? Authorizing Provider  ibuprofen (ADVIL,MOTRIN) 200 MG tablet Take 1,200 mg by mouth every 6 (six) hours as needed for moderate pain.   Yes [provider]  ciprofloxacin (CIPRO) 500 MG tablet Take 1 tablet (500 mg total) by mouth 2 (two) times daily. Patient not taking: Reported on 02/05/2017 03/19/14   Derwood Kaplan, MD  doxycycline (VIBRAMYCIN) 100 MG capsule Take 1 capsule (100 mg total) by mouth 2 (two) times daily. Patient not taking: Reported on 02/05/2017 11/07/16   Elson Areas, PA-C  metroNIDAZOLE (FLAGYL) 500 MG tablet Take 1 tablet (500 mg total) by mouth 2 (two) times daily. Patient not taking: Reported on 02/05/2017 03/19/14   Derwood Kaplan, MD  naproxen (NAPROSYN) 500 MG tablet Take 1 tablet (500 mg total) by mouth 2 (two) times daily. 02/05/17   Bethann Berkshire, MD  ondansetron (ZOFRAN-ODT) 8 MG disintegrating tablet Take 1 tablet (8 mg total) by mouth every 8 (eight) hours as needed for nausea or  vomiting. Patient not taking: Reported on 02/05/2017 03/21/14   Benjiman Core, MD  promethazine (PHENERGAN) 25 MG suppository Place 1 suppository (25 mg total) rectally every 6 (six) hours as needed for nausea. Patient not taking: Reported on 02/05/2017 03/19/14   Derwood Kaplan, MD  promethazine (PHENERGAN) 25 MG tablet Take 1 tablet (25 mg total) by mouth every 6 (six) hours as needed for nausea. Patient not taking: Reported on 02/05/2017 03/19/14   Derwood Kaplan, MD    Family History History reviewed. No pertinent family history.  Social History Social History  Substance Use Topics  . Smoking status: Current Every Day Smoker    Packs/day: 1.00    Types: Cigarettes  . Smokeless tobacco: Never Used  . Alcohol use Yes     Comment: socially     Allergies   Food   Review of Systems Review of Systems  Constitutional: Negative for appetite change and fatigue.  HENT: Negative for congestion, ear discharge and sinus pressure.   Eyes: Negative for discharge.  Respiratory: Negative for cough.   Cardiovascular: Negative for chest pain.  Gastrointestinal: Negative for abdominal pain and diarrhea.  Genitourinary: Negative for frequency and hematuria.  Musculoskeletal: Negative for back pain.       Hand pain  Skin: Negative for rash.  Neurological: Negative for seizures and headaches.  Psychiatric/Behavioral: Negative for hallucinations.     Physical Exam Updated Vital Signs BP  120/64   Pulse 80   Temp 98 F (36.7 C) (Oral)   Resp 18   Ht 5\' 2"  (1.575 m)   Wt 69.4 kg (153 lb)   SpO2 98%   BMI 27.98 kg/m   Physical Exam  Constitutional: She is oriented to person, place, and time. She appears well-developed.  HENT:  Head: Normocephalic.  Eyes: Conjunctivae are normal.  Neck: No tracheal deviation present.  Cardiovascular:  No murmur heard. Musculoskeletal: Normal range of motion.  Bruising to right elbow medially and tenderness. Patient also has tenderness to  thenar eminence  Neurological: She is oriented to person, place, and time.  Skin: Skin is warm.  Psychiatric: She has a normal mood and affect.     ED Treatments / Results  Labs (all labs ordered are listed, but only abnormal results are displayed) Labs Reviewed - No data to display  EKG  EKG Interpretation None       Radiology Dg Forearm Right  Result Date: 02/05/2017 CLINICAL DATA:  Acute pain EXAM: RIGHT FOREARM - 2 VIEW COMPARISON:  02/05/2017 FINDINGS: There is no evidence of fracture or other focal bone lesions. Soft tissues are unremarkable. IMPRESSION: Negative. Electronically Signed   By: Judie PetitM.  Shick M.D.   On: 02/05/2017 14:06   Dg Hand Complete Right  Result Date: 02/05/2017 CLINICAL DATA:  Acute pain EXAM: RIGHT HAND - COMPLETE 3+ VIEW COMPARISON:  02/05/2017 FINDINGS: There is no evidence of fracture or dislocation. There is no evidence of arthropathy or other focal bone abnormality. Soft tissues are unremarkable. IMPRESSION: Negative. Electronically Signed   By: Judie PetitM.  Shick M.D.   On: 02/05/2017 14:05    Procedures Procedures (including critical care time)  Medications Ordered in ED Medications - No data to display   Initial Impression / Assessment and Plan / ED Course  I have reviewed the triage vital signs and the nursing notes.  Pertinent labs & imaging results that were available during my care of the patient were reviewed by me and considered in my medical decision making (see chart for details).     X-rays unremarkable. Patient has neuritis to her right hand. Possible carpal tunnel or could be related to the contusion to her elbow. She will be placed on Naprosyn and will follow-up if not improving. Patient is kept out of work for couple days  Final Clinical Impressions(s) / ED Diagnoses   Final diagnoses:  Hand pain, right    New Prescriptions New Prescriptions   NAPROXEN (NAPROSYN) 500 MG TABLET    Take 1 tablet (500 mg total) by mouth 2 (two) times  daily.     Bethann BerkshireZammit, Johney Perotti, MD 02/05/17 [phone]

## 2017-02-05 NOTE — Discharge Instructions (Signed)
Follow up with dr. Harrison if not improving. °

## 2017-02-23 ENCOUNTER — Ambulatory Visit: Payer: Self-pay | Admitting: Orthopedic Surgery

## 2017-02-23 ENCOUNTER — Encounter: Payer: Self-pay | Admitting: Orthopedic Surgery

## 2019-02-12 IMAGING — DX DG HAND COMPLETE 3+V*R*
3 series · 3 of 3 positions shown · non-contrast
Comparison: 02/05/2017

CLINICAL DATA: Acute pain

EXAM:
RIGHT HAND - COMPLETE 3+ VIEW

[hand pa]
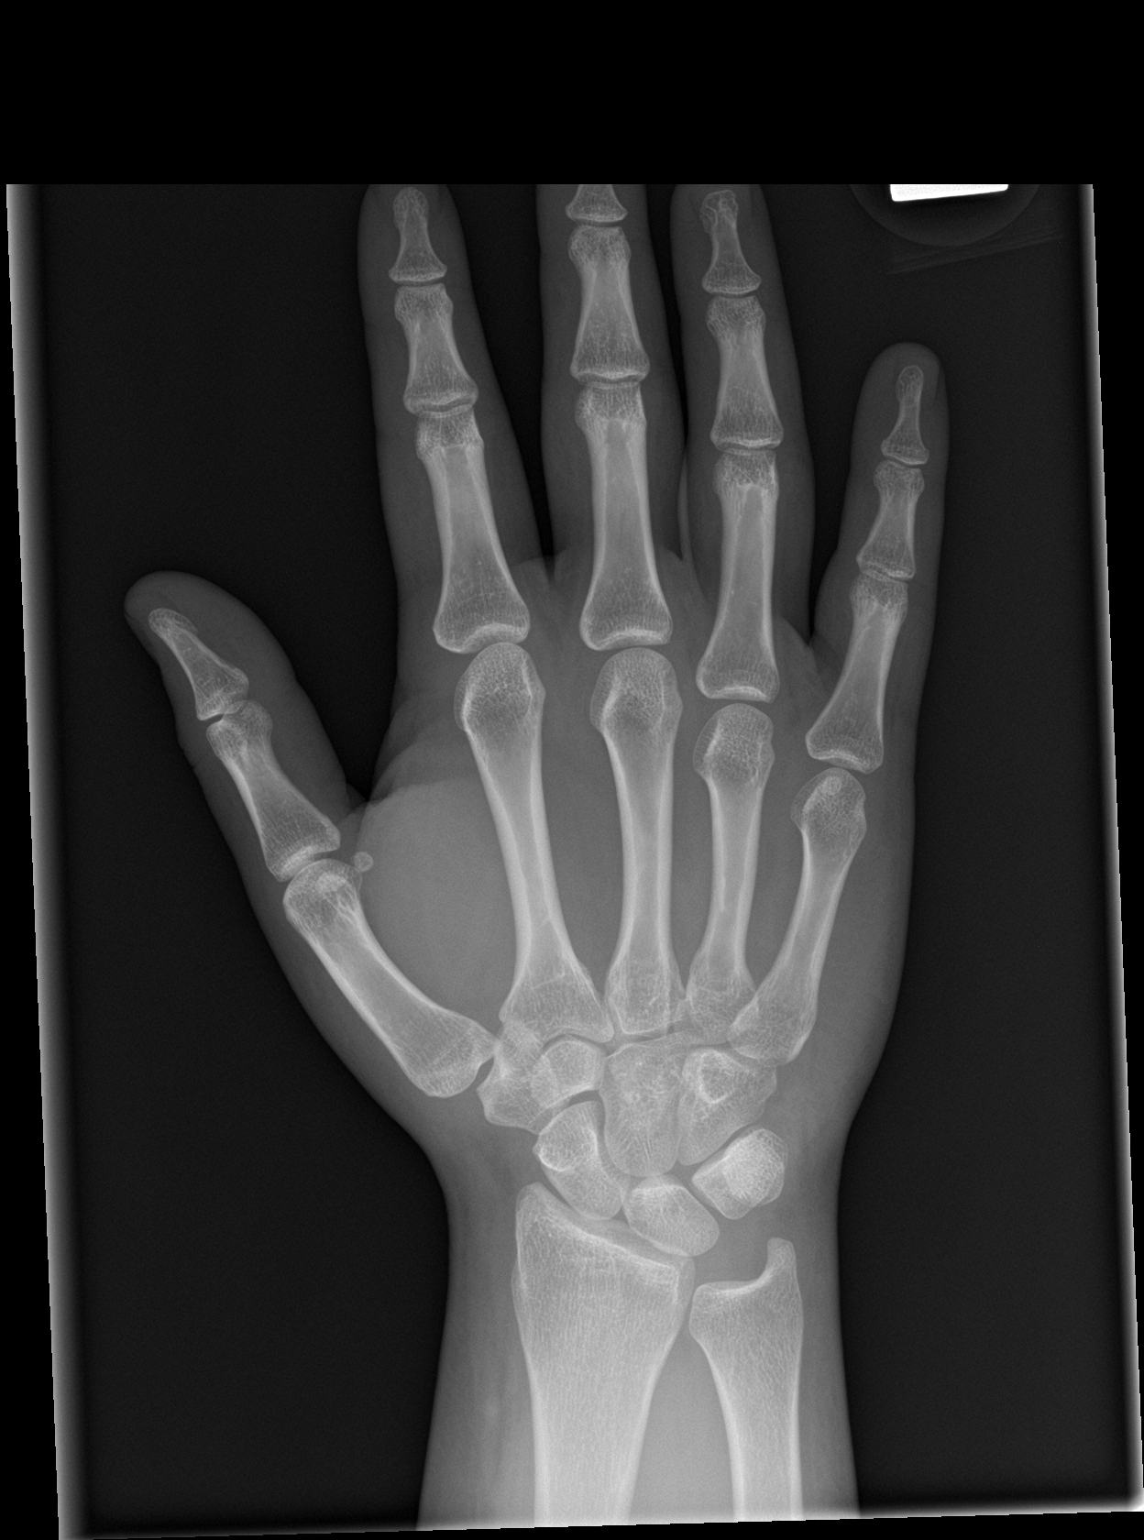

[hand obl]
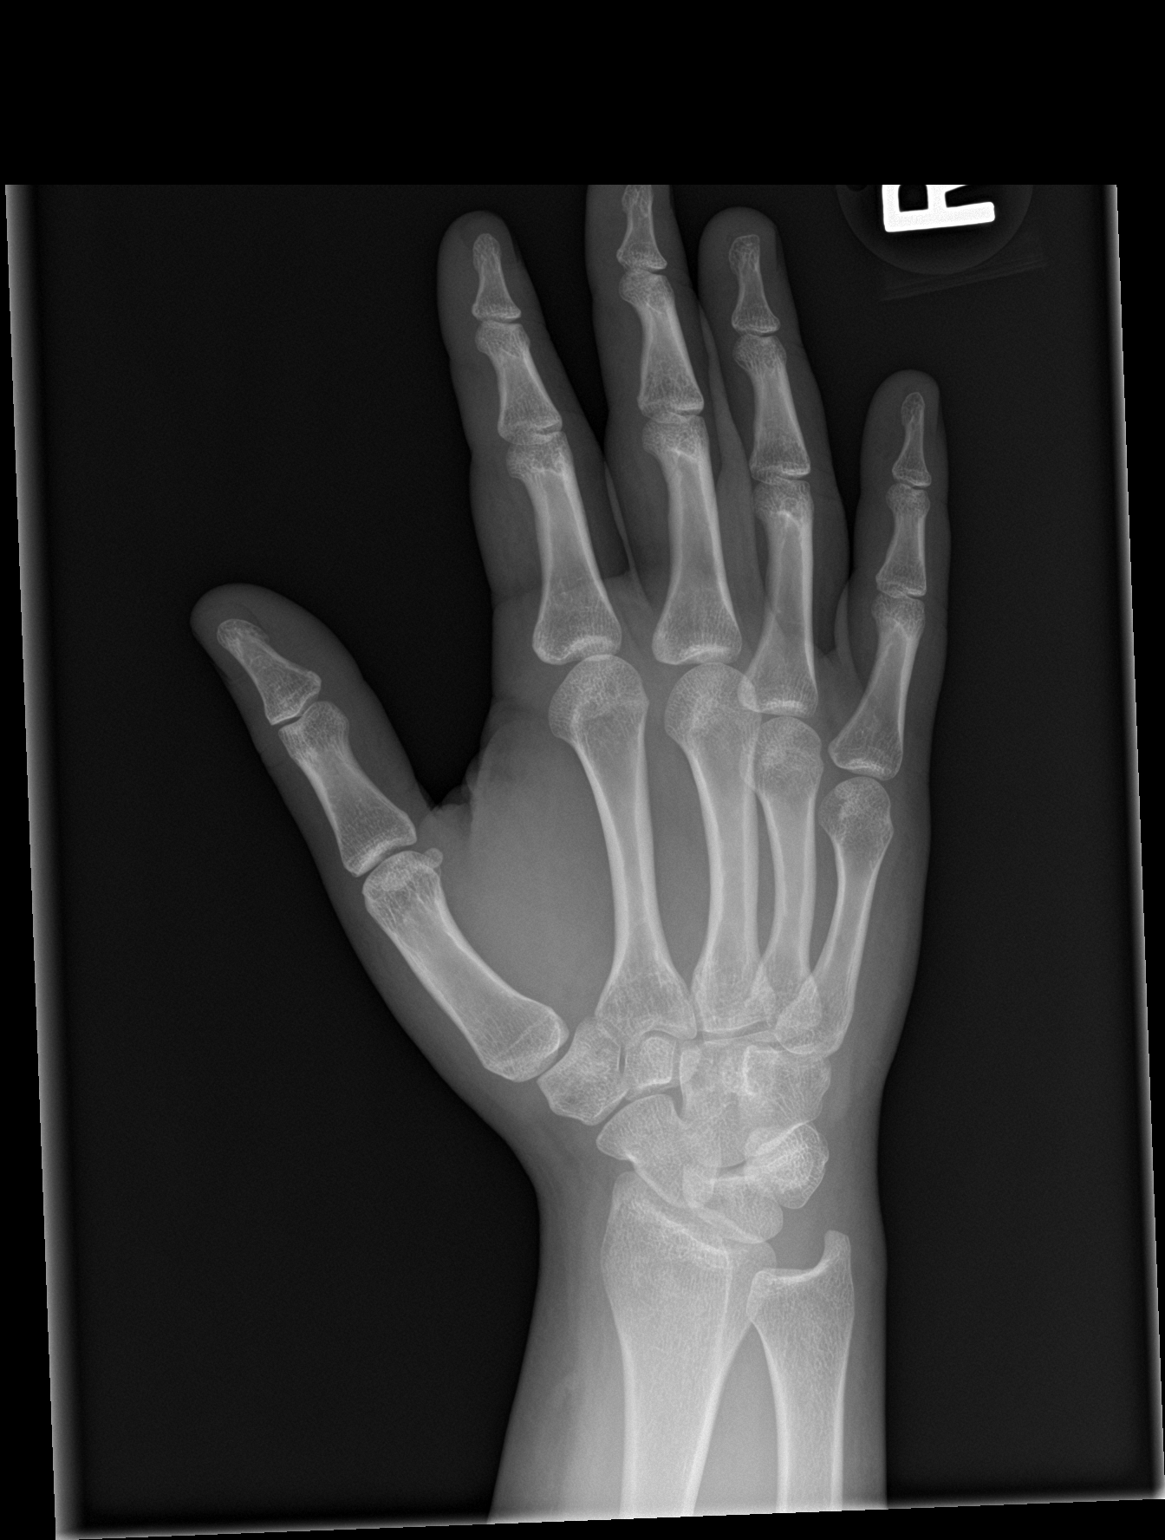

[hand lat]
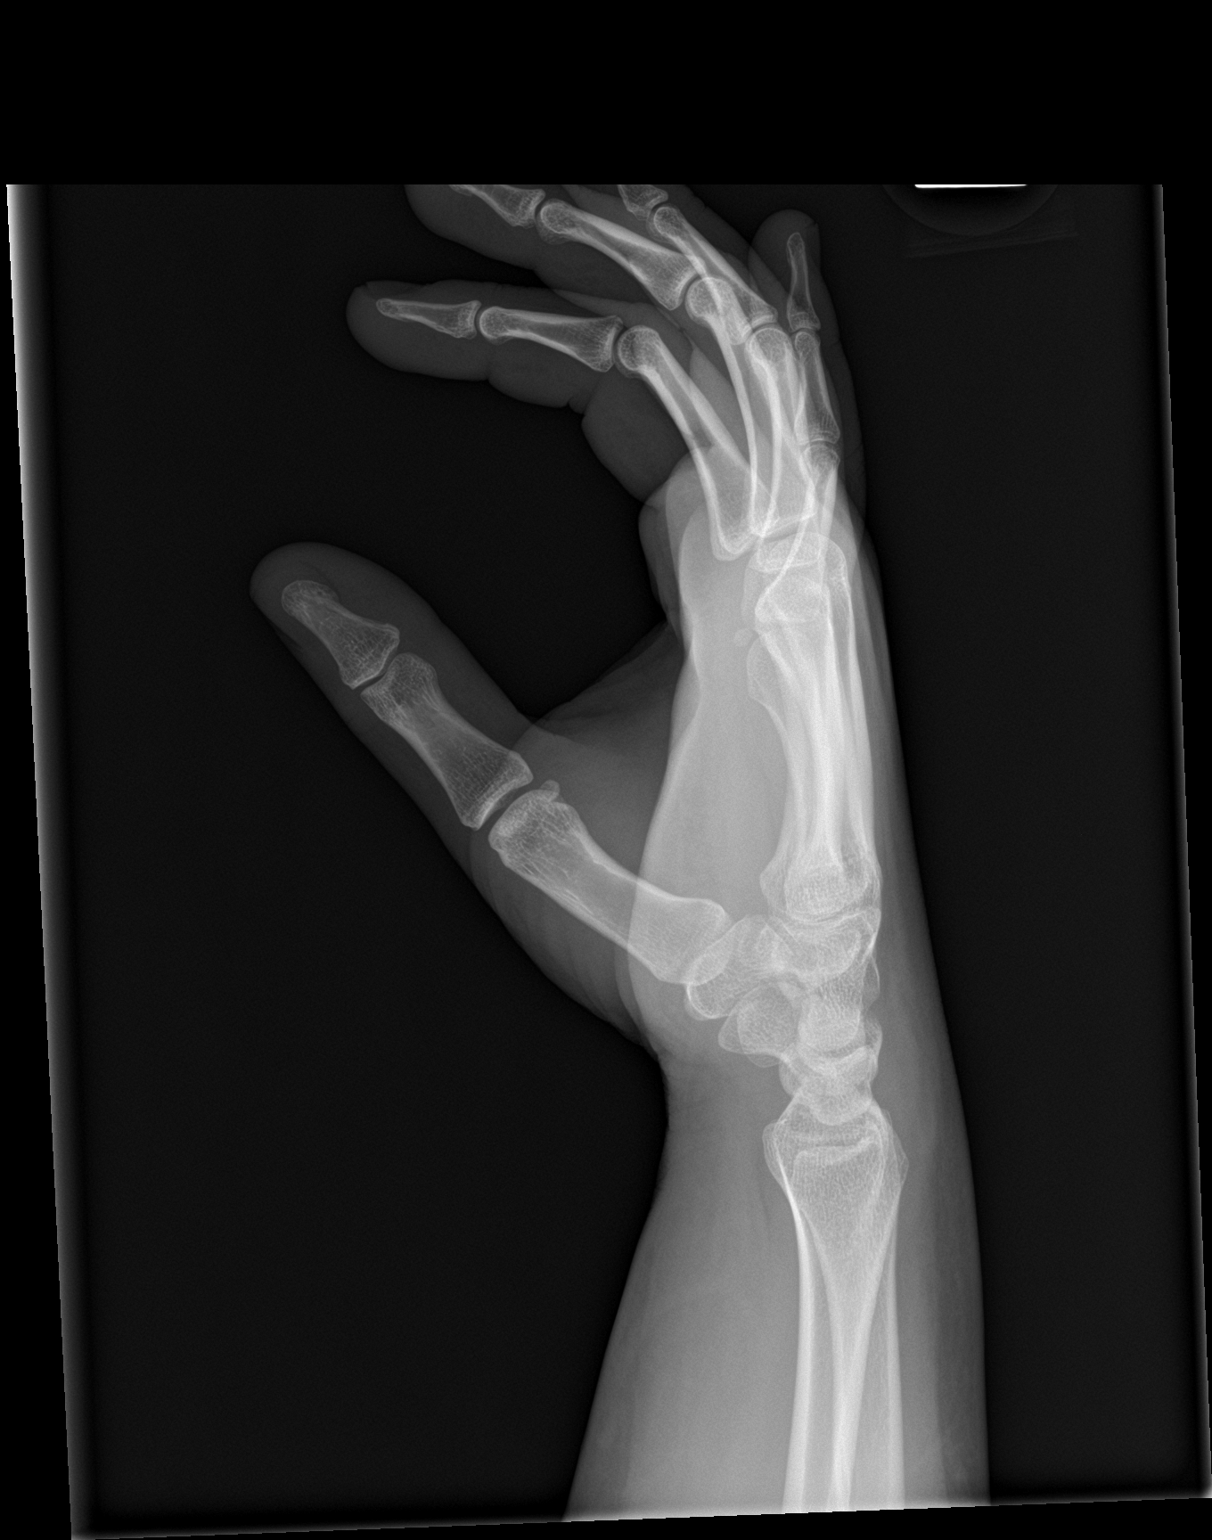

[3 of 3 positions shown; findings below may reference images not displayed]

FINDINGS: There is no evidence of fracture or dislocation. There is no
evidence of arthropathy or other focal bone abnormality. Soft
tissues are unremarkable.
IMPRESSION: Negative.

## 2020-07-25 ENCOUNTER — Other Ambulatory Visit: Payer: Self-pay

## 2020-07-25 ENCOUNTER — Emergency Department (HOSPITAL_COMMUNITY)
Admission: EM | Admit: 2020-07-25 | Discharge: 2020-07-25 | Discharge status: Against Medical Advice | Payer: Self-pay | Attending: Emergency Medicine | Admitting: Emergency Medicine

## 2020-07-25 ENCOUNTER — Encounter (HOSPITAL_COMMUNITY): Payer: Self-pay | Admitting: Emergency Medicine

## 2020-07-25 DIAGNOSIS — K0889 Other specified disorders of teeth and supporting structures: Secondary | ICD-10-CM | POA: Insufficient documentation

## 2020-07-25 NOTE — ED Notes (Addendum)
Pt became aggravated and began walking towards the exit. ED staff asked pt where she was going. Pt stated no one was helping her and she was upset to be placed in a hallway bed. Staff ensured pt the provider will see her in the hallway bed. Pt was noticeably upset/tearful and arguing on the phone with her SO. Pt stated she didn't want to wait and was seen walking out of the ED in no distress. Pt refused discharge vital signs. Provider made aware.

## 2020-07-25 NOTE — ED Triage Notes (Signed)
Patient is complaining of swelling on the right side of her mouth. Patient states she has a dentist appt and can not get in sooner.

## 2020-07-25 NOTE — ED Provider Notes (Cosign Needed)
10:46 PM: I went to evaluate this patient but I was informed by nursing staff that they left the facility.  Patient left without being seen after triage.  Note: Portions of this report may have been transcribed using voice recognition software. Every effort was made to ensure accuracy; however, inadvertent computerized transcription errors may still be present.   Bill Salinas, PA-C 07/25/20 2249

## 2020-11-19 ENCOUNTER — Emergency Department (HOSPITAL_COMMUNITY)
Admission: EM | Admit: 2020-11-19 | Discharge: 2020-11-19 | Disposition: A | Discharge status: Home / Self Care | Payer: BC Managed Care – PPO | Attending: Emergency Medicine | Admitting: Emergency Medicine

## 2020-11-19 ENCOUNTER — Encounter (HOSPITAL_COMMUNITY): Payer: Self-pay | Admitting: Pharmacy Technician

## 2020-11-19 ENCOUNTER — Emergency Department (HOSPITAL_COMMUNITY): Payer: BC Managed Care – PPO

## 2020-11-19 DIAGNOSIS — S0101XA Laceration without foreign body of scalp, initial encounter: Secondary | ICD-10-CM | POA: Insufficient documentation

## 2020-11-19 DIAGNOSIS — F17210 Nicotine dependence, cigarettes, uncomplicated: Secondary | ICD-10-CM | POA: Diagnosis not present

## 2020-11-19 DIAGNOSIS — Y990 Civilian activity done for income or pay: Secondary | ICD-10-CM | POA: Insufficient documentation

## 2020-11-19 DIAGNOSIS — S0990XA Unspecified injury of head, initial encounter: Secondary | ICD-10-CM | POA: Diagnosis present

## 2020-11-19 LAB — PREGNANCY, URINE: Preg Test, Ur: NEGATIVE
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
## 2020-11-19 NOTE — Discharge Instructions (Addendum)
Return if any problems.

## 2020-11-19 NOTE — ED Notes (Signed)
Pt stepped outside.  

## 2020-11-19 NOTE — ED Notes (Signed)
Pt verbalizes understanding of discharge instructions. Opportunity for questions and answers were provided. Pt discharged from the ED.   ?
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
## 2023-10-29 ENCOUNTER — Encounter (HOSPITAL_COMMUNITY): Payer: Self-pay

## 2023-10-29 ENCOUNTER — Emergency Department (HOSPITAL_COMMUNITY): Payer: MEDICAID

## 2023-10-29 ENCOUNTER — Inpatient Hospital Stay (HOSPITAL_COMMUNITY)
Admission: EM | Admit: 2023-10-29 | Discharge: 2023-11-01 | DRG: 603 | Disposition: A | Discharge status: Home / Self Care | Payer: MEDICAID | Attending: Internal Medicine | Admitting: Internal Medicine

## 2023-10-29 ENCOUNTER — Other Ambulatory Visit: Payer: Self-pay

## 2023-10-29 DIAGNOSIS — Z818 Family history of other mental and behavioral disorders: Secondary | ICD-10-CM

## 2023-10-29 DIAGNOSIS — F17210 Nicotine dependence, cigarettes, uncomplicated: Secondary | ICD-10-CM | POA: Diagnosis present

## 2023-10-29 DIAGNOSIS — F1113 Opioid abuse with withdrawal: Secondary | ICD-10-CM | POA: Diagnosis present

## 2023-10-29 DIAGNOSIS — Z23 Encounter for immunization: Secondary | ICD-10-CM

## 2023-10-29 DIAGNOSIS — F1990 Other psychoactive substance use, unspecified, uncomplicated: Secondary | ICD-10-CM

## 2023-10-29 DIAGNOSIS — L03114 Cellulitis of left upper limb: Secondary | ICD-10-CM | POA: Diagnosis present

## 2023-10-29 DIAGNOSIS — F1190 Opioid use, unspecified, uncomplicated: Secondary | ICD-10-CM

## 2023-10-29 DIAGNOSIS — Z5902 Unsheltered homelessness: Secondary | ICD-10-CM

## 2023-10-29 DIAGNOSIS — L0291 Cutaneous abscess, unspecified: Principal | ICD-10-CM

## 2023-10-29 DIAGNOSIS — R112 Nausea with vomiting, unspecified: Secondary | ICD-10-CM | POA: Diagnosis present

## 2023-10-29 DIAGNOSIS — E876 Hypokalemia: Secondary | ICD-10-CM | POA: Diagnosis present

## 2023-10-29 DIAGNOSIS — L02414 Cutaneous abscess of left upper limb: Principal | ICD-10-CM | POA: Diagnosis present

## 2023-10-29 DIAGNOSIS — F32A Depression, unspecified: Secondary | ICD-10-CM | POA: Diagnosis present

## 2023-10-29 LAB — TROPONIN I (HIGH SENSITIVITY)
Troponin I (High Sensitivity): 4 ng/L (ref <18)
Troponin I (High Sensitivity): 6 ng/L (ref <18)

## 2023-10-29 LAB — I-STAT CG4 LACTIC ACID, ED
Lactic Acid, Venous: 1.4 mmol/L (ref 0.5–1.9)
Lactic Acid, Venous: 1.5 mmol/L (ref 0.5–1.9)

## 2023-10-29 LAB — CBC WITH DIFFERENTIAL/PLATELET
Abs Immature Granulocytes: 0.05 10*3/uL (ref 0.00–0.07)
Basophils Absolute: 0 10*3/uL (ref 0.0–0.1)
Basophils Relative: 0 %
Eosinophils Absolute: 0 10*3/uL (ref 0.0–0.5)
Eosinophils Relative: 0 %
HCT: 43.6 % (ref 36.0–46.0)
Hemoglobin: 14.5 g/dL (ref 12.0–15.0)
Immature Granulocytes: 0 %
Lymphocytes Relative: 13 %
Lymphs Abs: 1.7 10*3/uL (ref 0.7–4.0)
MCH: 29.4 pg (ref 26.0–34.0)
MCHC: 33.3 g/dL (ref 30.0–36.0)
MCV: 88.4 fL (ref 80.0–100.0)
Monocytes Absolute: 0.7 10*3/uL (ref 0.1–1.0)
Monocytes Relative: 5 %
Neutro Abs: 10.1 10*3/uL — ABNORMAL HIGH (ref 1.7–7.7)
Neutrophils Relative %: 82 %
Platelets: 359 10*3/uL (ref 150–400)
RBC: 4.93 MIL/uL (ref 3.87–5.11)
RDW: 13.2 % (ref 11.5–15.5)
WBC: 12.6 10*3/uL — ABNORMAL HIGH (ref 4.0–10.5)
nRBC: 0 % (ref 0.0–0.2)

## 2023-10-29 LAB — HIV ANTIBODY (ROUTINE TESTING W REFLEX): HIV Screen 4th Generation wRfx: NONREACTIVE

## 2023-10-29 LAB — BASIC METABOLIC PANEL WITH GFR
Anion gap: 13 (ref 5–15)
BUN: 7 mg/dL (ref 6–20)
CO2: 25 mmol/L (ref 22–32)
Calcium: 9.9 mg/dL (ref 8.9–10.3)
Chloride: 100 mmol/L (ref 98–111)
Creatinine, Ser: 0.58 mg/dL (ref 0.44–1.00)
GFR, Estimated: >60 mL/min (ref >60)
Glucose, Bld: 97 mg/dL (ref 70–99)
Potassium: 3.8 mmol/L (ref 3.5–5.1)
Sodium: 138 mmol/L (ref 135–145)

## 2023-10-29 LAB — HEPATITIS PANEL, ACUTE
HCV Ab: NONREACTIVE
Hep A IgM: NONREACTIVE
Hep B C IgM: NONREACTIVE
Hepatitis B Surface Ag: NONREACTIVE

## 2023-10-29 MED ORDER — KETOROLAC TROMETHAMINE 15 MG/ML IJ SOLN
15.0000 mg | Freq: Once | INTRAMUSCULAR | Status: AC
  Administered 2023-10-29: 15 mg via INTRAVENOUS
  Filled 2023-10-29: qty 1

## 2023-10-29 MED ORDER — NICOTINE 14 MG/24HR TD PT24: 14.0000 mg | MEDICATED_PATCH | Freq: Every day | TRANSDERMAL | Status: DC

## 2023-10-29 MED ORDER — VANCOMYCIN HCL 750 MG/150ML IV SOLN
750.0000 mg | Freq: Two times a day (BID) | INTRAVENOUS | Status: DC
  Administered 2023-10-30 – 2023-11-01 (×5): 750 mg via INTRAVENOUS
  Filled 2023-10-29 (×6): qty 150

## 2023-10-29 MED ORDER — TETANUS-DIPHTH-ACELL PERTUSSIS 5-2.5-18.5 LF-MCG/0.5 IM SUSY
0.5000 mL | PREFILLED_SYRINGE | Freq: Once | INTRAMUSCULAR | Status: AC
  Administered 2023-10-29: 0.5 mL via INTRAMUSCULAR
  Filled 2023-10-29: qty 0.5

## 2023-10-29 MED ORDER — IBUPROFEN 200 MG PO TABS
600.0000 mg | ORAL_TABLET | Freq: Four times a day (QID) | ORAL | Status: DC | PRN
  Filled 2023-10-29: qty 3

## 2023-10-29 MED ORDER — VANCOMYCIN HCL IN DEXTROSE 1-5 GM/200ML-% IV SOLN
1000.0000 mg | Freq: Once | INTRAVENOUS | Status: AC
  Administered 2023-10-29: 1000 mg via INTRAVENOUS
  Filled 2023-10-29: qty 200

## 2023-10-29 MED ORDER — NICOTINE 21 MG/24HR TD PT24
21.0000 mg | MEDICATED_PATCH | Freq: Every day | TRANSDERMAL | Status: DC
  Administered 2023-10-30 – 2023-11-01 (×3): 21 mg via TRANSDERMAL
  Filled 2023-10-29 (×3): qty 1

## 2023-10-29 MED ORDER — OXYCODONE HCL 5 MG PO TABS: 5.0000 mg | ORAL_TABLET | ORAL | Status: DC | PRN

## 2023-10-29 MED ORDER — SENNOSIDES-DOCUSATE SODIUM 8.6-50 MG PO TABS: 1.0000 | ORAL_TABLET | Freq: Every evening | ORAL | Status: DC | PRN

## 2023-10-29 MED ORDER — IBUPROFEN 200 MG PO TABS: 600.0000 mg | ORAL_TABLET | Freq: Four times a day (QID) | ORAL | Status: DC | PRN

## 2023-10-29 MED ORDER — LIDOCAINE-EPINEPHRINE (PF) 2 %-1:200000 IJ SOLN
10.0000 mL | Freq: Once | INTRAMUSCULAR | Status: AC
  Administered 2023-10-29: 10 mL
  Filled 2023-10-29: qty 20

## 2023-10-29 MED ORDER — ACETAMINOPHEN 500 MG PO TABS
1000.0000 mg | ORAL_TABLET | Freq: Four times a day (QID) | ORAL | Status: DC
  Administered 2023-10-29 – 2023-11-01 (×7): 1000 mg via ORAL
  Filled 2023-10-29 (×8): qty 2

## 2023-10-29 MED ORDER — ENOXAPARIN SODIUM 40 MG/0.4ML IJ SOSY
40.0000 mg | PREFILLED_SYRINGE | INTRAMUSCULAR | Status: DC
  Administered 2023-10-30 – 2023-11-01 (×3): 40 mg via SUBCUTANEOUS
  Filled 2023-10-29 (×3): qty 0.4

## 2023-10-29 NOTE — Progress Notes (Signed)
 Pharmacy Antibiotic Note  Jamie Schroeder is a 28 y.o. female for which pharmacy has been consulted for vancomycin dosing for wound infection.  Patient with a history of IVDU. Patient presenting with left arm abscess for a few days .  SCr 0.58 WBC 12.6; LA 1.5; T 99.4; HR 102>90; RR 17>20  Plan: Vancomycin 1000 mg given once in ED ---Will start 750 mg q12hr (eAUC 479.6; Scr rounded to 0.8) unless change in renal function Monitor WBC, fever, renal function, cultures De-escalate when able  Height: 5\' 1"  (154.9 cm) Weight: 62.6 kg (138 lb) IBW/kg (Calculated) : 47.8  Temp (24hrs), Avg:99.1 F (37.3 C), Min:98.7 F (37.1 C), Max:99.4 F (37.4 C)  Recent Labs  Lab 10/29/23 1309 10/29/23 1314 10/29/23 1558  WBC 12.6*  --   --   CREATININE 0.58  --   --   LATICACIDVEN  --  1.4 1.5    Estimated Creatinine Clearance: 88 mL/min (by C-G formula based on SCr of 0.58 mg/dL).    Allergies  Allergen Reactions   Food Diarrhea and Nausea And Vomiting    Green peas   Microbiology results: Pending  Thank you for allowing pharmacy to be a part of this patient's care.  Dionicio Fray, PharmD, BCPS 10/29/2023 4:51 PM ED Clinical Pharmacist -  [phone]

## 2023-10-29 NOTE — ED Triage Notes (Signed)
 Pt came in via POV d/t an abscess that started 2 days ago & then it worsened to the point that it is approx. the size of a gold ball on her Lt FA. A/Ox4, rates her pain 10/10 during triage.

## 2023-10-29 NOTE — ED Provider Notes (Signed)
 [place] EMERGENCY DEPARTMENT AT [facility] Provider Note   CSN: [id number] Arrival date & time: 10/29/23  1222     History  Chief Complaint  Patient presents with   Abscess    Jamie Schroeder is a 28 y.o. female.  Patient is a 28 year old female with a history of IV drug use who presents with an abscess to her left arm.  She says it started a few days ago but she cannot tell me exactly when.  It has been getting more painful.  She has had some subjective fevers.  She says she did inject in that area and thinks it started then.  She is also having a headache and some nausea and think she is having some withdrawal symptoms.  She denies any alcohol use.  She denies any other drug use other than the opioids.  No shortness of breath.  She does report some intermittent chest pains.       Home Medications Prior to Admission medications   Medication Sig Start Date End Date Taking? Authorizing Provider  ciprofloxacin  (CIPRO ) 500 MG tablet Take 1 tablet (500 mg total) by mouth 2 (two) times daily. Patient not taking: Reported on 02/05/2017 03/19/14   Deatra Face, MD  doxycycline  (VIBRAMYCIN ) 100 MG capsule Take 1 capsule (100 mg total) by mouth 2 (two) times daily. Patient not taking: Reported on 02/05/2017 11/07/16   Sofia, Leslie K, PA-C  ibuprofen (ADVIL,MOTRIN) 200 MG tablet Take 1,200 mg by mouth every 6 (six) hours as needed for moderate pain.    [provider]  metroNIDAZOLE  (FLAGYL ) 500 MG tablet Take 1 tablet (500 mg total) by mouth 2 (two) times daily. Patient not taking: Reported on 02/05/2017 03/19/14   Deatra Face, MD  naproxen  (NAPROSYN ) 500 MG tablet Take 1 tablet (500 mg total) by mouth 2 (two) times daily. 02/05/17   Zammit, Joseph, MD  ondansetron  (ZOFRAN -ODT) 8 MG disintegrating tablet Take 1 tablet (8 mg total) by mouth every 8 (eight) hours as needed for nausea or vomiting. Patient not taking: Reported on 02/05/2017 03/21/14   Mozell Arias, MD   promethazine  (PHENERGAN ) 25 MG suppository Place 1 suppository (25 mg total) rectally every 6 (six) hours as needed for nausea. Patient not taking: Reported on 02/05/2017 03/19/14   Deatra Face, MD  promethazine  (PHENERGAN ) 25 MG tablet Take 1 tablet (25 mg total) by mouth every 6 (six) hours as needed for nausea. Patient not taking: Reported on 02/05/2017 03/19/14   Deatra Face, MD      Allergies    Food    Review of Systems   Review of Systems  Constitutional:  Positive for fatigue and fever. Negative for chills and diaphoresis.  HENT:  Negative for congestion, rhinorrhea and sneezing.   Eyes: Negative.   Respiratory:  Negative for cough, chest tightness and shortness of breath.   Cardiovascular:  Positive for chest pain. Negative for leg swelling.  Gastrointestinal:  Positive for nausea. Negative for abdominal pain, blood in stool, diarrhea and vomiting.  Genitourinary:  Negative for difficulty urinating, flank pain, frequency and hematuria.  Musculoskeletal:  Negative for arthralgias and back pain.  Skin:  Positive for wound. Negative for rash.  Neurological:  Negative for dizziness, speech difficulty, weakness, numbness and headaches.    Physical Exam Updated Vital Signs BP 120/73 (BP Location: Right Arm)   Pulse (!) 102   Temp 99.4 F (37.4 C) (Oral)   Resp 17   Ht 5\' 1"  (1.549 m)  Wt 62.6 kg   SpO2 95%   BMI 26.07 kg/m  Physical Exam Constitutional:      Appearance: She is well-developed.  HENT:     Head: Normocephalic and atraumatic.  Eyes:     Pupils: Pupils are equal, round, and reactive to light.  Cardiovascular:     Rate and Rhythm: Regular rhythm. Tachycardia present.     Heart sounds: Normal heart sounds.  Pulmonary:     Effort: Pulmonary effort is normal. No respiratory distress.     Breath sounds: Normal breath sounds. No wheezing or rales.  Chest:     Chest wall: No tenderness.  Abdominal:     General: Bowel sounds are normal.      Palpations: Abdomen is soft.     Tenderness: There is no abdominal tenderness. There is no guarding or rebound.  Musculoskeletal:        General: Normal range of motion.     Cervical back: Normal range of motion and neck supple.  Lymphadenopathy:     Cervical: No cervical adenopathy.  Skin:    General: Skin is warm and dry.     Findings: No rash.     Comments: Large fluctuant abscess to the volar surface of the left arm.  There is small amount of surrounding cellulitis.  Neurological:     Mental Status: She is alert and oriented to person, place, and time.     ED Results / Procedures / Treatments   Labs (all labs ordered are listed, but only abnormal results are displayed) Labs Reviewed  CBC WITH DIFFERENTIAL/PLATELET - Abnormal; Notable for the following components:      Result Value   WBC 12.6 (*)    Neutro Abs 10.1 (*)    All other components within normal limits  CULTURE, BLOOD (ROUTINE X 2)  CULTURE, BLOOD (ROUTINE X 2)  BASIC METABOLIC PANEL WITH GFR  I-STAT CG4 LACTIC ACID, ED  I-STAT CG4 LACTIC ACID, ED  TROPONIN I (HIGH SENSITIVITY)  TROPONIN I (HIGH SENSITIVITY)    EKG EKG Interpretation Date/Time:  Saturday Oct 29 2023 14:57:22 EDT Ventricular Rate:  91 PR Interval:  157 QRS Duration:  88 QT Interval:  332 QTC Calculation: 409 R Axis:   11  Text Interpretation: Sinus rhythm RSR' in V1 or V2, right VCD or RVH Borderline T abnormalities, anterior leads Confirmed by Hershel Los [phone]) on 10/29/2023 3:56:26 PM  Radiology DG Forearm Left Result Date: 10/29/2023 CLINICAL DATA:  Abscess on the radial aspect EXAM: LEFT FOREARM - 2 VIEW COMPARISON:  None Available. FINDINGS: No acute fracture or dislocation. Joint spaces and alignment are maintained. No area of erosion or osseous destruction. There is a linear area of high density near the level of the radioscaphoid joint measuring approximately 7 mm. There is adjacent rounded more calcific appearing density  immediately adjacent seen on oblique view. Diffuse soft tissue edema. IMPRESSION: 1. No acute fracture or dislocation.  Diffuse soft tissue edema. 2. There is a linear area of high density near the level of the radioscaphoid joint measuring approximately 7 mm. This is nonspecific and could reflect a foreign body. Smaller rounded calcific appearing area is noted on single oblique radiograph. Recommend correlation with physical exam. Electronically Signed   By: Clancy Crimes M.D.   On: 10/29/2023 13:39    Procedures .Incision and Drainage  Date/Time: 10/29/2023 3:58 PM  Performed by: Hershel Los, MD Authorized by: Hershel Los, MD   Consent:    Consent obtained:  Verbal  Consent given by:  Patient   Risks, benefits, and alternatives were discussed: yes   Location:    Type:  Abscess   Size:  4   Location:  Upper extremity   Upper extremity location:  Arm   Arm location:  L lower arm Pre-procedure details:    Skin preparation:  Povidone-iodine Sedation:    Sedation type:  None Anesthesia:    Anesthesia method:  Local infiltration   Local anesthetic:  Lidocaine  2% WITH epi Procedure type:    Complexity:  Simple Procedure details:    Ultrasound guidance: no     Incision types:  Elliptical   Incision depth:  Dermal   Wound management:  Probed and deloculated   Drainage:  Purulent   Drainage amount:  Copious   Wound treatment:  Wound left open   Packing materials:  None Post-procedure details:    Procedure completion:  Tolerated well, no immediate complications     Medications Ordered in ED Medications  lidocaine -EPINEPHrine  (XYLOCAINE  W/EPI) 2 %-1:200000 (PF) injection 10 mL (10 mLs Infiltration Given by Other 10/29/23 1532)  Tdap (BOOSTRIX ) injection 0.5 mL (0.5 mLs Intramuscular Given 10/29/23 1531)  vancomycin  (VANCOCIN ) IVPB 1000 mg/200 mL premix (0 mg Intravenous Stopped 10/29/23 1554)  ketorolac  (TORADOL ) 15 MG/ML injection 15 mg (15 mg Intravenous Given 10/29/23  1556)    ED Course/ Medical Decision Making/ A&P                                 Medical Decision Making Risk Prescription drug management. Decision regarding hospitalization.   Patient is a 28 year old with a history of IV drug use who presents with a large fluctuant wound to her left forearm.  Her temperature was 99.4.  She is mildly tachycardic in the ED.  Her labs show an elevated WBC count.  Her lactate is normal.  She complains of some chest pain but this is in conjunction with some nausea and headache and likely is having some withdrawal symptoms.  Her EKG does not show any ischemic changes.  Troponins are pending.  Her wound was I&D did with copious amounts of purulent drainage.  She has some surrounding cellulitis.  Will start IV vancomycin .  Her tetanus shot was updated.  Discussed with the internal medicine resident who admit the patient for further treatment.  Of note, the significant other who is at bedside said that she has been having some occasional hallucinations and will just start laughing uncontrollably with no provocation.  This has been going on for over a month.  She denies any underlying psych disorder but says her mom does have a bipolar disorder.  Question whether she has some underlying psychiatric disorder.  She is alert and oriented and acting appropriately currently.  Final Clinical Impression(s) / ED Diagnoses Final diagnoses:  Abscess    Rx / DC Orders ED Discharge Orders     None         Hershel Los, MD 10/29/23 1601

## 2023-10-29 NOTE — ED Provider Triage Note (Signed)
 Emergency Medicine Provider Triage Evaluation Note  Jamie Schroeder , a 28 y.o. female  was evaluated in triage.  Pt complains of abscess to left forearm that is been present for a day.  Patient states that she does use IV drugs and has injected in the area as recently as last week and does endorse some subjective fevers at [facility].  Patient denies any purulent material or weakness.  Review of Systems  Positive:  Negative:   Physical Exam  BP 120/73 (BP Location: Right Arm)   Pulse (!) 102   Temp 98.7 F (37.1 C)   Resp 17   SpO2 95%  Gen:   Awake, no distress   Resp:  Normal effort  MSK:   Moves extremities without difficulty  Other:  Large abscess noted to the radial side of the left forearm  Medical Decision Making  Medically screening exam initiated at 12:44 PM.  Appropriate orders placed.  LINDZY RUPERT was informed that the remainder of the evaluation will be completed by another provider, this initial triage assessment does not replace that evaluation, and the importance of remaining in the ED until their evaluation is complete.  Workup initiated, patient stable.   Denese Finn, PA-C 10/29/23 1247

## 2023-10-29 NOTE — ED Notes (Signed)
 [address] [employer] notified of pt being transported upstairs

## 2023-10-29 NOTE — Hospital Course (Signed)
 Left arm abscess, s/p I&D, improving MRSE in 1/4 blood cultures, likely contaminant Presented with significant left forearm swelling, erythema, and painful fluctuance, consistent with a left forearm abscess. She reports that she recently injected IV fentanyl  in this area. She was started on IV vancomycin. Forearm x-ray did not show any evidence of osteomyelitis. An I&D was done in the ED with drainage of a purulent fluid collection. She was afebrile, mildly tachycardic, and hemodynamically stable. WBC mildly up at 12.6 on presentation. Blood cultures grew 1/4 bottles of methicillin-resistant staph epidermidis, very likely a contaminant. Her swelling, erythema and pain of left arm improved significantly following I&D and antibiotics. She was transitioned to oral antibiotics and discharged with 5 more days of Linezolid for a total of 7 days.   Opioid use disorder History of withdrawal She reports a long history of IV fentanyl  use, with last known use on 5/23. She underwent withdrawal symptoms in the hospital with a COWS score up to 12. She was started on clonidine and other prn medications to help treat her withdrawal symptoms. She was interested in starting suboxone, which she had success with in the past, so started her on it and she will be discharged with suboxone 4-1 mg to take twice daily. We have given her a short course of suboxone until she is able to follow up in our clinic for reassessment and refills.   History of depression History of hallucinations No SI/HI during hospital stay. She feels her hallucinatory symptoms have resolved following withdrawal and improvement of her infection. Used to take an SSRI, not on any psychiatric medications currently. Suspect hallucinations may be substance-induced in nature. No hallucinations appreciated during her hospital stay.

## 2023-10-29 NOTE — H&P (Cosign Needed Addendum)
 Date: 10/29/2023               Patient Name:  Jamie Schroeder MRN: [id number]  DOB: Nov 25, 1995 Age / Sex: 28 y.o., female   PCP: Patient, No Pcp Per         Medical Service: Internal Medicine Teaching Service         Attending Physician: Dr. Bevelyn Bryant, MD      First Contact: Dorthy Gavia, MD    Second Contact: Jearldine Mina, DO         After Hours (After 5p/  First Contact Pager: [phone]  weekends / holidays): Second Contact Pager: [phone]   SUBJECTIVE   Chief Complaint: left arm abscess  History of Present Illness:  Jamie Schroeder is a 27 year old female with a past medical history of IV drug use who presents with a left arm abscess for a few days. Her partner is bedside.   She says that she began to feel strangely a few days ago and then noticed swelling and pain of her left forearm.  She states that she injected a substance she thought was pain pills into her forearm prior to this swelling.  She endorses some fever, episodic chest pain and abdominal pain in the past few days, but denies any of this at this time.  She denies any nausea but had 1 episode of clear liquid vomiting.  She says that she last injected fentanyl  yesterday and has been using it for years.  She denies any other IV or recreational drug use, but sometimes she is not sure what she is injecting.  She does not lick or share needles. She denies any other wounds elsewhere. She says she only injects in her arms.  She states she previously tried to stop using fentanyl  and was at [facility] but left. She also tried a methadone clinic about 2-3 years ago.   Her partner states that he noticed that she was starting to have hallucinations and seemed to be slow to understand things, so he grew concerned about a month ago. She denies visual or auditory hallucinations. Per partner, at times she will laugh inappropriately or talk to people who are not present. She denies active hallucinations and states she is fine.   ED  Course: Blood cultures collected I&D of abscess in ED, with purulent drainage Vancomycin given Afebrile, hemodynamically stable Toradol  in ED Troponin pending  Meds:  None reported by patient   Past Medical History: Opioid use disorder Depression  Past Surgical History: Past Surgical History:  Procedure Laterality Date   ADENOIDECTOMY     Social:  Lives With: partner but currently unhomed  Support: partner Level of Function: independent with ADL PCP: None  Substances: -Tobacco: 0.5 ppd since high school  -Alcohol: denies  -Recreational Drug: IV fentanyl  for years (last use was 5/23)  Family History: Mom has bipolar disorder.   Allergies: Allergies as of 10/29/2023 - Review Complete 10/29/2023  Allergen Reaction Noted   Food Diarrhea and Nausea And Vomiting 03/21/2014   Review of Systems: A complete ROS was negative except as per HPI.   OBJECTIVE:   Physical Exam: Blood pressure 120/73, pulse (!) 102, temperature 99.4 F (37.4 C), temperature source Oral, resp. rate 17, height 5\' 1"  (1.549 m), weight 62.6 kg, SpO2 95%.  Constitutional: well-appearing, sitting up in bed in no distress, partner is bedside HENT: normocephalic atraumatic, mucous membranes moist Eyes: conjunctiva non-erythematous Neck: supple Cardiovascular: regular rate and rhythm, no m/r/g Pulmonary/Chest: normal work  of breathing on room air, lungs clear to auscultation bilaterally Abdominal: soft, non-tender, non-distended MSK: normal bulk and tone Neurological: alert & oriented x 3, 5/5 strength in bilateral upper and lower extremities, normal gait Skin: warm and dry Psych: anxious affect, delayed response time  Labs: CBC    Component Value Date/Time   WBC 12.6 (H) 10/29/2023 1309   RBC 4.93 10/29/2023 1309   HGB 14.5 10/29/2023 1309   HCT 43.6 10/29/2023 1309   PLT 359 10/29/2023 1309   MCV 88.4 10/29/2023 1309   MCH 29.4 10/29/2023 1309   MCHC 33.3 10/29/2023 1309   RDW 13.2  10/29/2023 1309   LYMPHSABS 1.7 10/29/2023 1309   MONOABS 0.7 10/29/2023 1309   EOSABS 0.0 10/29/2023 1309   BASOSABS 0.0 10/29/2023 1309    CMP     Component Value Date/Time   NA 138 10/29/2023 1309   K 3.8 10/29/2023 1309   CL 100 10/29/2023 1309   CO2 25 10/29/2023 1309   GLUCOSE 97 10/29/2023 1309   BUN 7 10/29/2023 1309   CREATININE 0.58 10/29/2023 1309   CALCIUM 9.9 10/29/2023 1309   PROT 8.5 (H) 03/21/2014 2042   ALBUMIN 4.9 03/21/2014 2042   AST 13 03/21/2014 2042   ALT 10 03/21/2014 2042   ALKPHOS 81 03/21/2014 2042   BILITOT 0.8 03/21/2014 2042   GFRNONAA >60 10/29/2023 1309   GFRAA >90 03/21/2014 2042   Imaging: DG Forearm Left CLINICAL DATA:  Abscess on the radial aspect  EXAM: LEFT FOREARM - 2 VIEW  COMPARISON:  None Available.  FINDINGS: No acute fracture or dislocation. Joint spaces and alignment are maintained. No area of erosion or osseous destruction. There is a linear area of high density near the level of the radioscaphoid joint measuring approximately 7 mm. There is adjacent rounded more calcific appearing density immediately adjacent seen on oblique view. Diffuse soft tissue edema.  IMPRESSION: 1. No acute fracture or dislocation.  Diffuse soft tissue edema. 2. There is a linear area of high density near the level of the radioscaphoid joint measuring approximately 7 mm. This is nonspecific and could reflect a foreign body. Smaller rounded calcific appearing area is noted on single oblique radiograph. Recommend correlation with physical exam.  Electronically Signed   By: Clancy Crimes M.D.   On: 10/29/2023 13:39  EKG: personally reviewed my interpretation is sinus rhythm, no acute ischemic changes.   ASSESSMENT & PLAN:  Assessment & Plan by Problem: Principal Problem:   Abscess of left arm  Jamie Schroeder is a 28 y.o. person living with a history of IV drug use who presented with left arm swelling and is admitted for left arm  abscess and cellulitis on hospital day 0.  Left arm abscess with surrounding cellulitis Incised in the ED with purulent drainage. Received one dose of vancomycin. Blood cultures collected. She is afebrile and hemodynamically stable, HR upper normal. WBC mildly up. Infection likely introduced from recent IV drug use in left arm. We will continue treatment with IV antibiotics and monitor for improvement.  - Continue vancomycin - F/u blood cultures - Echocardiogram - HIV, Hepatitis panel - CBC, BMP in am  Opioid use disorder History of withdrawal Long history of IV fentanyl  use, last use yesterday. Per chart review, previously was stable on suboxone for ~5 years, but relapsed around 2023. Accidentally precipitated withdrawal with suboxone once, so she may have some aversion to it. No other reported drug or alcohol use, however she is sometimes unsure what  is mixed in the drug she is using. No active withdraw currently, but expect her to do so in the next 12 hours.  - COWS; if >8, will give sublingual suboxone ; if she has a strong aversion to suboxone , can consider subutex  while in the hospital - Can consider clonidine  and ativan  for sympathetic relief during withdrawal as needed - Cardiac telemetry - Will start a bowel regimen  Chest pain EKG unremarkable. Troponin 4. Given age and lack of cardiac history, unlikely cardiogenic. Will rule out endocarditis given IV drug use and active infection.  - Tylenol  1000 mg q6h, ibuprofen  600mg  q6h for mild pain, oxycodone  5 mg q4h prn for moderate pain  History of depression History of hallucinations (?) Per patient, she was previously on an SSRI but did not like the way it made her feel. Does not know which one. She says she feels fine currently. Her partner notes some strange behavior, confusion, and hallucinations over the past month or so, but the patient denies this. We will continue to reassess during her hospital stay with a low threshold to consult  our psychiatric colleagues if needed.   Diet: Normal VTE: Lovenox  IVF: None Code: Full  Prior to Admission Living Arrangement: Home, living with partner Anticipated Discharge Location: Home Barriers to Discharge: symptom improvement, endocarditis r/o  Dispo: Admit patient to Observation with expected length of stay less than 2 midnights.  Signed: Dorthy Gavia, MD Internal Medicine Resident, PGY-1 [facility]   10/29/2023, 5:15 PM

## 2023-10-30 ENCOUNTER — Other Ambulatory Visit (HOSPITAL_COMMUNITY)

## 2023-10-30 DIAGNOSIS — L03114 Cellulitis of left upper limb: Secondary | ICD-10-CM | POA: Diagnosis present

## 2023-10-30 DIAGNOSIS — F1113 Opioid abuse with withdrawal: Secondary | ICD-10-CM | POA: Diagnosis present

## 2023-10-30 DIAGNOSIS — F1190 Opioid use, unspecified, uncomplicated: Secondary | ICD-10-CM

## 2023-10-30 DIAGNOSIS — Z818 Family history of other mental and behavioral disorders: Secondary | ICD-10-CM | POA: Diagnosis not present

## 2023-10-30 DIAGNOSIS — F1990 Other psychoactive substance use, unspecified, uncomplicated: Secondary | ICD-10-CM | POA: Diagnosis not present

## 2023-10-30 DIAGNOSIS — R112 Nausea with vomiting, unspecified: Secondary | ICD-10-CM | POA: Diagnosis present

## 2023-10-30 DIAGNOSIS — Z23 Encounter for immunization: Secondary | ICD-10-CM | POA: Diagnosis not present

## 2023-10-30 DIAGNOSIS — F17210 Nicotine dependence, cigarettes, uncomplicated: Secondary | ICD-10-CM | POA: Diagnosis present

## 2023-10-30 DIAGNOSIS — L02414 Cutaneous abscess of left upper limb: Secondary | ICD-10-CM | POA: Diagnosis present

## 2023-10-30 DIAGNOSIS — Z5902 Unsheltered homelessness: Secondary | ICD-10-CM | POA: Diagnosis not present

## 2023-10-30 DIAGNOSIS — F32A Depression, unspecified: Secondary | ICD-10-CM | POA: Diagnosis present

## 2023-10-30 DIAGNOSIS — E876 Hypokalemia: Secondary | ICD-10-CM | POA: Diagnosis present

## 2023-10-30 LAB — BLOOD CULTURE ID PANEL (REFLEXED) - BCID2

## 2023-10-30 LAB — BASIC METABOLIC PANEL WITH GFR
Anion gap: 12 (ref 5–15)
BUN: 12 mg/dL (ref 6–20)
CO2: 23 mmol/L (ref 22–32)
Calcium: 9.6 mg/dL (ref 8.9–10.3)
Chloride: 101 mmol/L (ref 98–111)
Creatinine, Ser: 0.68 mg/dL (ref 0.44–1.00)
GFR, Estimated: >60 mL/min (ref >60)
Glucose, Bld: 91 mg/dL (ref 70–99)
Potassium: 3.4 mmol/L — ABNORMAL LOW (ref 3.5–5.1)
Sodium: 136 mmol/L (ref 135–145)

## 2023-10-30 LAB — CBC
HCT: 40.2 % (ref 36.0–46.0)
Hemoglobin: 13.9 g/dL (ref 12.0–15.0)
MCH: 29.4 pg (ref 26.0–34.0)
MCHC: 34.6 g/dL (ref 30.0–36.0)
MCV: 85.2 fL (ref 80.0–100.0)
Platelets: 353 10*3/uL (ref 150–400)
RBC: 4.72 MIL/uL (ref 3.87–5.11)
RDW: 13 % (ref 11.5–15.5)
WBC: 7.1 10*3/uL (ref 4.0–10.5)
nRBC: 0 % (ref 0.0–0.2)

## 2023-10-30 LAB — RPR: RPR Ser Ql: NONREACTIVE

## 2023-10-30 MED ORDER — CLONIDINE HCL 0.1 MG PO TABS: 0.1000 mg | ORAL_TABLET | ORAL | Status: DC

## 2023-10-30 MED ORDER — METHOCARBAMOL 500 MG PO TABS
500.0000 mg | ORAL_TABLET | Freq: Three times a day (TID) | ORAL | Status: DC | PRN
  Administered 2023-10-30 – 2023-11-01 (×4): 500 mg via ORAL
  Filled 2023-10-30 (×4): qty 1

## 2023-10-30 MED ORDER — CLONIDINE HCL 0.1 MG PO TABS
0.1000 mg | ORAL_TABLET | Freq: Four times a day (QID) | ORAL | Status: DC
  Administered 2023-10-30 – 2023-10-31 (×4): 0.1 mg via ORAL
  Filled 2023-10-30 (×4): qty 1

## 2023-10-30 MED ORDER — ONDANSETRON HCL 4 MG/2ML IJ SOLN
4.0000 mg | Freq: Once | INTRAMUSCULAR | Status: AC
  Administered 2023-10-30: 4 mg via INTRAVENOUS
  Filled 2023-10-30: qty 2

## 2023-10-30 MED ORDER — ONDANSETRON HCL 4 MG/2ML IJ SOLN: 4.0000 mg | Freq: Once | INTRAMUSCULAR | Status: DC

## 2023-10-30 MED ORDER — LACTATED RINGERS IV SOLN: INTRAVENOUS | Status: AC

## 2023-10-30 MED ORDER — HYDROXYZINE HCL 25 MG PO TABS
25.0000 mg | ORAL_TABLET | Freq: Four times a day (QID) | ORAL | Status: DC | PRN
  Administered 2023-10-30 – 2023-10-31 (×2): 25 mg via ORAL
  Filled 2023-10-30 (×3): qty 1

## 2023-10-30 MED ORDER — MUPIROCIN 2 % EX OINT
TOPICAL_OINTMENT | Freq: Two times a day (BID) | CUTANEOUS | Status: DC
  Filled 2023-10-30: qty 22

## 2023-10-30 MED ORDER — POTASSIUM CHLORIDE 20 MEQ PO PACK
40.0000 meq | PACK | Freq: Two times a day (BID) | ORAL | Status: DC
  Administered 2023-10-30 – 2023-10-31 (×3): 40 meq via ORAL
  Filled 2023-10-30 (×3): qty 2

## 2023-10-30 MED ORDER — LOPERAMIDE HCL 2 MG PO CAPS: 2.0000 mg | ORAL_CAPSULE | ORAL | Status: DC | PRN

## 2023-10-30 MED ORDER — ONDANSETRON HCL 4 MG/2ML IJ SOLN
4.0000 mg | Freq: Three times a day (TID) | INTRAMUSCULAR | Status: DC | PRN
  Administered 2023-10-30 – 2023-10-31 (×2): 4 mg via INTRAVENOUS
  Filled 2023-10-30 (×3): qty 2

## 2023-10-30 MED ORDER — DICYCLOMINE HCL 20 MG PO TABS: 20.0000 mg | ORAL_TABLET | Freq: Four times a day (QID) | ORAL | Status: DC | PRN

## 2023-10-30 MED ORDER — NAPROXEN 250 MG PO TABS: 500.0000 mg | ORAL_TABLET | Freq: Two times a day (BID) | ORAL | Status: DC | PRN

## 2023-10-30 MED ORDER — CLONIDINE HCL 0.1 MG PO TABS: 0.1000 mg | ORAL_TABLET | Freq: Every day | ORAL | Status: DC

## 2023-10-30 MED ORDER — LORAZEPAM 2 MG/ML IJ SOLN
1.0000 mg | Freq: Once | INTRAMUSCULAR | Status: AC
  Administered 2023-10-30: 1 mg via INTRAVENOUS
  Filled 2023-10-30: qty 1

## 2023-10-30 NOTE — Progress Notes (Signed)
 Woke up patient as her telemetry leads are off, off note, patient was covered with blankets the whole night and gets irritated with this nurse whenever she is awaken for meds and assessment. Offered to change to patient's gown as her clothes are sweaty but patient covered herself back with blankets.

## 2023-10-30 NOTE — Progress Notes (Signed)
 PHARMACY - PHYSICIAN COMMUNICATION CRITICAL VALUE ALERT - BLOOD CULTURE IDENTIFICATION (BCID)  Jamie Schroeder is an 28 y.o. female who presented to [facility] on 10/29/2023 with a chief complaint of L arm abscess with surrounding cellulitis.   Assessment:  1/4 + Blood cultures for Staphylococcus epidermidis (mecA +, MRSE)  Name of physician (or Provider) Contacted: Dorthy Gavia, MD  Current antibiotics: Vancomycin  Changes to prescribed antibiotics recommended:  Patient is on recommended antibiotics - No changes needed  No results found for this or any previous visit.  [name] 10/30/2023  11:55 AM

## 2023-10-30 NOTE — Consult Note (Signed)
 WOC Nurse Consult Note: patient had abscess L forearm that was I&D'd in ED 10/29/2023  Reason for Consult: L forearm abscess post I&D   Wound type: full thickness infectious as above  Pressure Injury POA: NA  Measurement: see nursing flowsheet  Wound bed: red moist  Drainage (amount, consistency, odor) see nursing flowsheet  Periwound:erythema and edema  Dressing procedure/placement/frequency: Cleanse L forearm wound with Vashe wound cleanser Timm Foot [id number]) do not rinse and allow to air dry. Apply Mupirocin ointment to wound bed 2 times daily and cover with Telfa nonstick dressing and tape or silicone foam.   POC discussed with bedside nurse. WOc team will not follow. Re-consult if further needs arise.   Thank you,    Ronni Colace MSN, RN-BC, [employer] [phone]

## 2023-10-30 NOTE — Plan of Care (Signed)
  Problem: Clinical Measurements: Goal: Will remain free from infection Outcome: Not Progressing   Problem: Clinical Measurements: Goal: Diagnostic test results will improve Outcome: Not Progressing   Problem: Coping: Goal: Level of anxiety will decrease Outcome: Not Progressing   Problem: Safety: Goal: Ability to remain free from injury will improve Outcome: Not Progressing

## 2023-10-30 NOTE — Plan of Care (Signed)

## 2023-10-30 NOTE — Progress Notes (Addendum)
  Overnight events: None  Subjective: Feels poorly this morning, sweating. Unable to specify what feels bad in particular, just a general feeling. No other new concerns.   Objective:  Vital signs in last 24 hours: Vitals:   10/29/23 1838 10/29/23 1948 10/30/23 0503 10/30/23 0820  BP: 110/69 108/65 127/88 (!) 95/53  Pulse: 88 88 93 92  Resp: 18 16 16 20   Temp: 98.6 F (37 C) 98.7 F (37.1 C) 98.6 F (37 C) 97.8 F (36.6 C)  TempSrc: Oral     SpO2: 99% 97% 100% 100%  Weight: 59.3 kg     Height: 5\' 1"  (1.549 m)      Physical Exam: Constitutional: ill-appearing, in no acute distress Cardio: Regular rate and rhythm Pulm: Clear to auscultation bilaterally, normal rate and effort Abdomen: flat, non-distended Skin: left arm dressing and packing in place, bloody; no purulence; surrounding erythema, non-tender to light palpation Neuro: Alert and oriented x 3, no focal deficits Psych: Anxious affect and delayed response time, unchanged from yesterday  Labs: Potassium 3.4 BMP otherwise normal CBC normal Troponins flat HIV non-reactive Hepatitis panel negative  Assessment/Plan: Principal Problem:   Abscess of left arm  Patient Summary: Left arm abscess with surrounding cellulitis Bacteremia, gram positive cocci Arm looks better this morning, decreased swelling and erythema. Dressing and packing in place, bloody. WBC normalized, afebrile. BC positive for gram positive cocci in 1/4 bottles, species pending. Will continue with IV antibiotics. - IV vancomycin - Echocardiogram - F/u blood cultures, gram positive cocci, species pending - Wound care consult - RPR, G/C - LR 75cc/hr, 20 hours  Opioid use disorder History of withdrawal Sweaty and generally feels poorly. COWS 3 this morning. Spoke with her about whether she intends to stop using, but her answer was equivocal and she states that she has difficulty getting her medications previously (Suboxone, methadone in the past)  because her boyfriend is the one who has to drive her there, so she will have to check with him. We will follow up on this and initiate a medication if she would like to try to quit using fentanyl . - COWS - Will treat withdrawal symptoms as needed - Cardiac telemetry  History of depression History of hallucinations (?) She denies any SI/HI. Answers questions appropriately and does not appear to be seeing or interacting with hallucinatory stimuli. Suspect hallucinatory experiences may be substance-induced, but will keep infection-related causes in consideration. No acute issues this morning.   Hypokalemia Potassium 3.4. this morning. Repleted with 40 mEq. Will recheck in am.   Tobacco use disorder Nicotine patch ordered.   Diet: Normal IVF:  LR 75cc/hr, 20 hours VTE: Enoxaparin Code: Full   Dispo: Anticipated discharge to  Home tomorrow pending blood cultures.   Dorthy Gavia, MD 10/30/2023, 10:24 AM After 5pm on weekdays and 1pm on weekends: On Call pager [phone]

## 2023-10-31 ENCOUNTER — Other Ambulatory Visit (HOSPITAL_COMMUNITY)

## 2023-10-31 DIAGNOSIS — L02414 Cutaneous abscess of left upper limb: Secondary | ICD-10-CM | POA: Diagnosis not present

## 2023-10-31 LAB — BASIC METABOLIC PANEL WITH GFR
Anion gap: 13 (ref 5–15)
BUN: 14 mg/dL (ref 6–20)
CO2: 24 mmol/L (ref 22–32)
Calcium: 9.5 mg/dL (ref 8.9–10.3)
Chloride: 101 mmol/L (ref 98–111)
Creatinine, Ser: 0.78 mg/dL (ref 0.44–1.00)
GFR, Estimated: >60 mL/min (ref >60)
Glucose, Bld: 90 mg/dL (ref 70–99)
Potassium: 3.2 mmol/L — ABNORMAL LOW (ref 3.5–5.1)
Sodium: 138 mmol/L (ref 135–145)

## 2023-10-31 LAB — CBC
HCT: 39.6 % (ref 36.0–46.0)
Hemoglobin: 13.6 g/dL (ref 12.0–15.0)
MCH: 29.2 pg (ref 26.0–34.0)
MCHC: 34.3 g/dL (ref 30.0–36.0)
MCV: 85 fL (ref 80.0–100.0)
Platelets: 471 10*3/uL — ABNORMAL HIGH (ref 150–400)
RBC: 4.66 MIL/uL (ref 3.87–5.11)
RDW: 13.2 % (ref 11.5–15.5)
WBC: 7.9 10*3/uL (ref 4.0–10.5)
nRBC: 0 % (ref 0.0–0.2)

## 2023-10-31 LAB — MAGNESIUM: Magnesium: 2.2 mg/dL (ref 1.7–2.4)

## 2023-10-31 MED ORDER — ONDANSETRON HCL 4 MG/2ML IJ SOLN
4.0000 mg | Freq: Four times a day (QID) | INTRAMUSCULAR | Status: DC | PRN
  Administered 2023-10-31 – 2023-11-01 (×3): 4 mg via INTRAVENOUS
  Filled 2023-10-31 (×3): qty 2

## 2023-10-31 MED ORDER — BUPRENORPHINE HCL-NALOXONE HCL 2-0.5 MG SL SUBL
1.0000 | SUBLINGUAL_TABLET | Freq: Once | SUBLINGUAL | Status: AC
  Administered 2023-10-31: 1 via SUBLINGUAL
  Filled 2023-10-31: qty 1

## 2023-10-31 MED ORDER — BUPRENORPHINE HCL-NALOXONE HCL 2-0.5 MG SL SUBL
1.0000 | SUBLINGUAL_TABLET | Freq: Once | SUBLINGUAL | Status: AC
  Administered 2023-10-31: 1 via SUBLINGUAL
  Filled 2023-10-31 (×2): qty 1

## 2023-10-31 MED ORDER — LACTATED RINGERS IV SOLN: INTRAVENOUS | Status: AC

## 2023-10-31 MED ORDER — POTASSIUM CHLORIDE 10 MEQ/100ML IV SOLN
10.0000 meq | INTRAVENOUS | Status: AC
  Administered 2023-10-31 (×4): 10 meq via INTRAVENOUS
  Filled 2023-10-31 (×4): qty 100

## 2023-10-31 NOTE — Plan of Care (Signed)
  Problem: Clinical Measurements: Goal: Ability to maintain clinical measurements within normal limits will improve Outcome: Progressing Goal: Will remain free from infection Outcome: Progressing   Problem: Activity: Goal: Risk for activity intolerance will decrease Outcome: Progressing   Problem: Nutrition: Goal: Adequate nutrition will be maintained Outcome: Progressing   Problem: Pain Managment: Goal: General experience of comfort will improve and/or be controlled Outcome: Progressing   Problem: Safety: Goal: Ability to remain free from injury will improve Outcome: Progressing   Problem: Skin Integrity: Goal: Risk for impaired skin integrity will decrease Outcome: Progressing

## 2023-10-31 NOTE — Plan of Care (Signed)
  Problem: Clinical Measurements: Goal: Will remain free from infection Outcome: Not Progressing   Problem: Coping: Goal: Level of anxiety will decrease Outcome: Not Progressing   Problem: Safety: Goal: Ability to remain free from injury will improve Outcome: Not Progressing   Problem: Skin Integrity: Goal: Risk for impaired skin integrity will decrease Outcome: Not Progressing

## 2023-10-31 NOTE — Progress Notes (Addendum)
  Overnight events: Persistent nausea and vomiting  Subjective: Feels better this morning. Threw up a lot yesterday, every 10-20 minutes. Arm hurts less. States she would like to start suboxone. No new concerns.   Objective:  Vital signs in last 24 hours: Vitals:   10/30/23 1614 10/30/23 2025 10/31/23 0436 10/31/23 0900  BP: 124/88 103/83 113/69 107/68  Pulse: 72 87 (!) 55 (!) 54  Resp: 20  16 20   Temp: 98.4 F (36.9 C) 98.7 F (37.1 C) 98.2 F (36.8 C) 98.7 F (37.1 C)  TempSrc:   Oral Oral  SpO2: 99% 97% 95% 100%  Weight:      Height:       Physical Exam: Constitutional: ill-appearing, laying in bed, in no acute distress, partner bedside Cardio: Regular rate and rhythm, no murmurs Pulm: Clear to auscultation bilaterally, normal rate and effort Skin: left swelling and erythema resolving, dressing dry and in tact; appears sweaty and flushed Neuro: Alert and oriented x 3, no focal deficits Psych: Mentation improved today, more cogent and coherent, appropriate response time  Labs: Potassium 3.2 BMP otherwise normal CBC normal RPR non reactive  Assessment/Plan: Principal Problem:   Abscess of left arm Active Problems:   IVDU (intravenous drug user)   Opioid use disorder  Patient Summary: Left arm abscess, s/p I&D, improving MRSE in 1/4 blood cultures, likely contaminant Remains afebrile, no leukocytosis. Swelling and erythema in left arm is much improved, pain is minimal, bleeding has slowed. She feels much better overall. Cultures grew MRSE in one anaerobic bottle, very likely contaminant, but will run it by ID. Will discontinue echocardiogram and switch to PO antibiotics once she can tolerate PO for coverage of her soft tissue infection.  - Will continue IV vancomycin and switch to Linezolid once tolerating PO - Will continue fluids given persistent emesis and inability to tolerate PO intake  Opioid use disorder History of withdrawal She had withdrawal symptoms  yesterday, but feels a little better today.  She says she threw up constantly, has not even been able to tolerate fluids.  Her mental status was improved this morning and she seems to be doing better.  Will decrease interval of her antiemetics given her persistent nausea and will continue with fluids. Spoke with her this morning and she would like to initiate Suboxone to try to quit using IV fentanyl .  We will keep her here as we titrate up her Suboxone and treat her symptoms. - Will start suboxone 2-0.5 today; can titrate up as needed for symptom relief - Discontinued clonidine - Zofran  q6h prn  History of depression History of hallucinations (?) Asymptomatic this morning.  She states she thinks that her hallucinations may have been related to her drug use and infection.   Hypokalemia Potassium 3.2. this morning. Unable to tolerate PO. Will give 40 mEq IV potassium.   Tobacco use disorder Nicotine patch ordered.   Diet: Normal IVF:  LR 75cc/hr, 20 hours VTE: Enoxaparin Code: Full   Dispo: Anticipated discharge to  Home tomorrow pending suboxone titration.   Dorthy Gavia, MD 10/31/2023, 11:01 AM After 5pm on weekdays and 1pm on weekends: On Call pager [phone]

## 2023-11-01 ENCOUNTER — Telehealth (HOSPITAL_COMMUNITY): Payer: Self-pay | Admitting: Pharmacy Technician

## 2023-11-01 ENCOUNTER — Other Ambulatory Visit (HOSPITAL_COMMUNITY): Payer: Self-pay

## 2023-11-01 DIAGNOSIS — F1190 Opioid use, unspecified, uncomplicated: Secondary | ICD-10-CM | POA: Diagnosis not present

## 2023-11-01 DIAGNOSIS — L02414 Cutaneous abscess of left upper limb: Secondary | ICD-10-CM | POA: Diagnosis not present

## 2023-11-01 LAB — BASIC METABOLIC PANEL WITH GFR
Anion gap: 11 (ref 5–15)
BUN: 8 mg/dL (ref 6–20)
CO2: 22 mmol/L (ref 22–32)
Calcium: 9.5 mg/dL (ref 8.9–10.3)
Chloride: 105 mmol/L (ref 98–111)
Creatinine, Ser: 0.74 mg/dL (ref 0.44–1.00)
GFR, Estimated: >60 mL/min (ref >60)
Glucose, Bld: 96 mg/dL (ref 70–99)
Potassium: 3.7 mmol/L (ref 3.5–5.1)
Sodium: 138 mmol/L (ref 135–145)

## 2023-11-01 LAB — CBC
HCT: 39.7 % (ref 36.0–46.0)
Hemoglobin: 13.5 g/dL (ref 12.0–15.0)
MCH: 29.5 pg (ref 26.0–34.0)
MCHC: 34 g/dL (ref 30.0–36.0)
MCV: 86.9 fL (ref 80.0–100.0)
Platelets: 450 10*3/uL — ABNORMAL HIGH (ref 150–400)
RBC: 4.57 MIL/uL (ref 3.87–5.11)
RDW: 13.3 % (ref 11.5–15.5)
WBC: 7.4 10*3/uL (ref 4.0–10.5)
nRBC: 0 % (ref 0.0–0.2)

## 2023-11-01 LAB — CULTURE, BLOOD (ROUTINE X 2)

## 2023-11-01 MED ORDER — BUPRENORPHINE HCL-NALOXONE HCL 4-1 MG SL FILM: 1.0000 | ORAL_FILM | Freq: Two times a day (BID) | SUBLINGUAL | 0 refills | Status: AC

## 2023-11-01 MED ORDER — MUPIROCIN 2 % EX OINT: TOPICAL_OINTMENT | Freq: Two times a day (BID) | CUTANEOUS | 0 refills | Status: AC

## 2023-11-01 MED ORDER — LINEZOLID 600 MG PO TABS: 600.0000 mg | ORAL_TABLET | Freq: Two times a day (BID) | ORAL | 0 refills | Status: AC

## 2023-11-01 MED ORDER — LINEZOLID 600 MG PO TABS
600.0000 mg | ORAL_TABLET | Freq: Two times a day (BID) | ORAL | 0 refills | Status: DC
  Filled 2023-11-01: qty 10, 5d supply, fill #0

## 2023-11-01 MED ORDER — BUPRENORPHINE HCL-NALOXONE HCL 4-1 MG SL FILM
1.0000 | ORAL_FILM | Freq: Two times a day (BID) | SUBLINGUAL | 0 refills | Status: DC
  Filled 2023-11-01: qty 14, 7d supply, fill #0

## 2023-11-01 MED ORDER — MUPIROCIN 2 % EX OINT
TOPICAL_OINTMENT | Freq: Two times a day (BID) | CUTANEOUS | 0 refills | Status: DC
  Filled 2023-11-01: qty 22, 7d supply, fill #0

## 2023-11-01 MED ORDER — NICOTINE 21 MG/24HR TD PT24
21.0000 mg | MEDICATED_PATCH | Freq: Every day | TRANSDERMAL | 0 refills | Status: DC
  Filled 2023-11-01: qty 28, 28d supply, fill #0

## 2023-11-01 MED ORDER — NICOTINE 21 MG/24HR TD PT24: 21.0000 mg | MEDICATED_PATCH | Freq: Every day | TRANSDERMAL | 0 refills | Status: AC

## 2023-11-01 MED ORDER — BUPRENORPHINE HCL-NALOXONE HCL 8-2 MG SL SUBL
0.5000 | SUBLINGUAL_TABLET | Freq: Every day | SUBLINGUAL | Status: AC
  Administered 2023-11-01: 0.5 via SUBLINGUAL
  Filled 2023-11-01: qty 1

## 2023-11-01 NOTE — Telephone Encounter (Signed)
 Pharmacy Patient Advocate Encounter  Insurance verification completed.    The patient is insured through [employer] [place].     Ran test claim for Suboxone 8/2mg  tablets, suboxone 8/2mg  films and the current 30 day co-pay is $0.00.   This test claim was processed through  [facility]- copay amounts may vary at other pharmacies due to pharmacy/plan contracts, or as the patient moves through the different stages of their insurance plan.

## 2023-11-01 NOTE — Discharge Summary (Signed)
 Name: Jamie Schroeder MRN: [id number] DOB: [birth year] 28 y.o. PCP: Patient, No Pcp Per  Date of Admission: 10/29/2023 12:30 PM Date of Discharge:  11/01/2023 Attending Physician: Dr. Jarvis Mesa  DISCHARGE DIAGNOSIS:  Primary Problem: Abscess of left arm   Hospital Problems: Principal Problem:   Abscess of left arm Active Problems:   IVDU (intravenous drug user)   Opioid use disorder    DISCHARGE MEDICATIONS:   Allergies as of 11/01/2023       Reactions   Food Diarrhea, Nausea And Vomiting   Green peas        Medication List     TAKE these medications    Buprenorphine HCl-Naloxone HCl 4-1 MG Film Place 1 Film under the tongue in the morning and at bedtime for 7 days.   linezolid 600 MG tablet Commonly known as: ZYVOX Take 1 tablet (600 mg total) by mouth 2 (two) times daily for 5 days.   mupirocin ointment 2 % Commonly known as: BACTROBAN Apply topically 2 (two) times daily.   nicotine 21 mg/24hr patch Commonly known as: NICODERM CQ - dosed in mg/24 hours Place 1 patch (21 mg total) onto the skin daily. Start taking on: Nov 02, 2023               Discharge Care Instructions  (From admission, onward)           Start     Ordered   11/01/23 0000  Discharge wound care:       Comments: Cleanse L forearm wound with Vashe wound cleanser Timm Foot [phone]) do not rinse and allow to air dry. Apply Mupirocin ointment to wound bed 2 times daily and cover with Telfa nonstick dressing and tape or silicone foam   11/01/23 1324            DISPOSITION AND FOLLOW-UP:  Jamie Schroeder was discharged from [facility] in Good condition. At the hospital follow up visit please address:  Left forearm abscess, s/p I&D: Reevaluate left forearm. Ensure completion of Linezolid antibiotic course.   Opioid use disorder: Started on suboxone 4-1 mg twice daily at discharge. Recommend checking a toxassure and refilling this medication in clinic if she is  doing well on it.   Follow-up Appointments:  Follow-up Information     Dorthy Gavia, MD. Go on 11/07/2023.   Specialty: Internal Medicine Why: Appointment on: 11/07/2023 10:15 AM We have moved! Our new location is at the [facility] located at [address] [place], [place], [place] [address]. Our office is located on 1st floor. Contact information: [address] [place] [address] [phone]                HOSPITAL COURSE:   Left arm abscess, s/p I&D, improving MRSE in 1/4 blood cultures, likely contaminant Presented with significant left forearm swelling, erythema, and painful fluctuance, consistent with a left forearm abscess. She reports that she recently injected IV fentanyl  in this area. She was started on IV vancomycin. Forearm x-ray did not show any evidence of osteomyelitis. An I&D was done in the ED with drainage of a purulent fluid collection. She was afebrile, mildly tachycardic, and hemodynamically stable. WBC mildly up at 12.6 on presentation. Blood cultures grew 1/4 bottles of methicillin-resistant staph epidermidis, very likely a contaminant. Her swelling, erythema and pain of left arm improved significantly following I&D and antibiotics. She was transitioned to oral antibiotics and discharged with 5 more days of Linezolid for a total of  7 days.   Opioid use disorder History of withdrawal She reports a long history of IV fentanyl  use, with last known use on 5/23. She underwent withdrawal symptoms in the hospital with a COWS score up to 12. She was started on clonidine and other prn medications to help treat her withdrawal symptoms. She was interested in starting suboxone, which she had success with in the past, so started her on it and she will be discharged with suboxone 4-1 mg to take twice daily. We have given her a short course of suboxone until she is able to follow up in our clinic for reassessment and refills.   History of depression History of  hallucinations No SI/HI during hospital stay. She feels her hallucinatory symptoms have resolved following withdrawal and improvement of her infection. Used to take an SSRI, not on any psychiatric medications currently. Suspect hallucinations may be substance-induced in nature. No hallucinations appreciated during her hospital stay.    DISCHARGE INSTRUCTIONS:   Discharge Instructions     Call MD for:  difficulty breathing, headache or visual disturbances   Complete by: As directed    Call MD for:  persistant dizziness or light-headedness   Complete by: As directed    Call MD for:  persistant nausea and vomiting   Complete by: As directed    Call MD for:  redness, tenderness, or signs of infection (pain, swelling, redness, odor or green/yellow discharge around incision site)   Complete by: As directed    Call MD for:  severe uncontrolled pain   Complete by: As directed    Call MD for:  temperature >100.4   Complete by: As directed    Discharge instructions   Complete by: As directed    You were hospitalized for a left forearm abscess. You will be discharged with oral antibiotics to finish your treatment of this infection. You were also started on suboxone. Thank you for allowing us  to be part of your care.   We arranged for you to follow up at: [facility] on 6/2 at 10:15am. The address of the clinic is [address]   Please note these changes made to your medications:   Start taking Linezolid (zyvox), an antibiotic, twice a day for the next 5 days  Take Suboxone (buprenorphine-naloxone) 4-1 mg films twice daily for the next week until you are able to follow up in our clinic.   We have provided you with nicotine patches to help with tobacco cessation.   If you have any worsening pain, swelling, or redness in your left arm, or you experience shortness of breath, persistent nausea and vomiting, or fevers, please return to the emergency department for further  evaluation.  Otherwise, please follow-up with us  in clinic.   Discharge wound care:   Complete by: As directed    Cleanse L forearm wound with Vashe wound cleanser Timm Foot [phone]) do not rinse and allow to air dry. Apply Mupirocin ointment to wound bed 2 times daily and cover with Telfa nonstick dressing and tape or silicone foam   Increase activity slowly   Complete by: As directed        SUBJECTIVE:   Feels she is doing really well today and asking about going home.  We spoke about her desire to stop using fentanyl  and to start Suboxone.  She reiterated this and did well on Suboxone yesterday, so we will discharge her with a daily dose of Suboxone and will have close follow-up in our  clinic.  Discharge Vitals:   BP 115/88 (BP Location: Left Arm)   Pulse (!) 58   Temp 98.3 F (36.8 C)   Resp 16   Ht 5\' 1"  (1.549 m)   Wt 59.3 kg   SpO2 99%   BMI 24.70 kg/m   OBJECTIVE:  Physical Exam Cardiovascular:     Rate and Rhythm: Normal rate and regular rhythm.  Pulmonary:     Effort: Pulmonary effort is normal.  Abdominal:     General: Abdomen is flat.  Skin:    General: Skin is warm and dry.     Comments: Dressing in place in the left forearm at site of abscess.  Pain, swelling, and erythema almost completely gone this morning.  Neurological:     General: No focal deficit present.     Mental Status: She is alert and oriented to person, place, and time.  Psychiatric:        Mood and Affect: Mood normal.        Behavior: Behavior normal.     Pertinent Labs, Studies, and Procedures:     Latest Ref Rng & Units 11/01/2023    6:00 AM 10/31/2023    6:14 AM 10/30/2023    5:45 AM  CBC  WBC 4.0 - 10.5 K/uL 7.4  7.9  7.1   Hemoglobin 12.0 - 15.0 g/dL 78.4  69.6  29.5   Hematocrit 36.0 - 46.0 % 39.7  39.6  40.2   Platelets 150 - 400 K/uL 450  471  353       Latest Ref Rng & Units 11/01/2023    6:00 AM 10/31/2023    6:14 AM 10/30/2023    5:45 AM  CMP  Glucose 70 - 99 mg/dL 96  90   91   BUN 6 - 20 mg/dL 8  14  12    Creatinine 0.44 - 1.00 mg/dL 2.84  1.32  4.40   Sodium 135 - 145 mmol/L 138  138  136   Potassium 3.5 - 5.1 mmol/L 3.7  3.2  3.4   Chloride 98 - 111 mmol/L 105  101  101   CO2 22 - 32 mmol/L 22  24  23    Calcium 8.9 - 10.3 mg/dL 9.5  9.5  9.6    DG Forearm Left Result Date: 10/29/2023 CLINICAL DATA:  Abscess on the radial aspect EXAM: LEFT FOREARM - 2 VIEW COMPARISON:  None Available. FINDINGS: No acute fracture or dislocation. Joint spaces and alignment are maintained. No area of erosion or osseous destruction. There is a linear area of high density near the level of the radioscaphoid joint measuring approximately 7 mm. There is adjacent rounded more calcific appearing density immediately adjacent seen on oblique view. Diffuse soft tissue edema. IMPRESSION: 1. No acute fracture or dislocation.  Diffuse soft tissue edema. 2. There is a linear area of high density near the level of the radioscaphoid joint measuring approximately 7 mm. This is nonspecific and could reflect a foreign body. Smaller rounded calcific appearing area is noted on single oblique radiograph. Recommend correlation with physical exam. Electronically Signed   By: Clancy Crimes M.D.   On: 10/29/2023 13:39     Signed: Dorthy Gavia, MD Internal Medicine Resident, PGY-1 [facility]  1:32 PM, 11/01/2023

## 2023-11-01 NOTE — Progress Notes (Signed)
 Pt has DC order. AVS was given to pt and significant others. Dressing change instruction was done, supplies provided. Medications prepared by TOC, this RN walked with pt to TOC to pick up meds before DC.

## 2023-11-01 NOTE — TOC CAGE-AID Note (Signed)
 Transition of Care [facility]) - CAGE-AID Screening   Patient Details  Name: Jamie Schroeder MRN: [id number] Date of Birth: 25-Jul-1995  Transition of [facility]) CM/SW Contact:    Elspeth Hals, LCSW Phone Number: 11/01/2023, 1:51 PM   Clinical Narrative: CSW met with pt for cage aid.  Pt reports daily use of fentanyl  for the past 6 months.  Pt has spoken with attending  regarding suboxone  follow up and is not wanting to pursue other treatment options at this time.  Resource list of treatment providers provided for future reference if needed.      CAGE-AID Screening:    Have You Ever Felt You Ought to Cut Down on Your Drinking or Drug Use?: Yes Have People Annoyed You By Critizing Your Drinking Or Drug Use?: Yes Have You Felt Bad Or Guilty About Your Drinking Or Drug Use?: Yes Have You Ever Had a Drink or Used Drugs First Thing In The Morning to Steady Your Nerves or to Get Rid of a Hangover?: Yes CAGE-AID Score: 4  Substance Abuse Education Offered: Yes  Substance abuse interventions: Other (must comment) (treatment resource list)

## 2023-11-01 NOTE — Plan of Care (Signed)
  Problem: Clinical Measurements: Goal: Will remain free from infection Outcome: Not Progressing   Problem: Coping: Goal: Level of anxiety will decrease Outcome: Not Progressing   Problem: Safety: Goal: Ability to remain free from injury will improve Outcome: Not Progressing   Problem: Skin Integrity: Goal: Risk for impaired skin integrity will decrease Outcome: Not Progressing

## 2023-11-01 NOTE — Progress Notes (Signed)
 CSW requested by MD to speak with pt regarding shelter resources.  Pt reports currently staying in her car, is interested in shelter options.  Discussed [facility] ending homelessness program, list of shelters/IRC provided. Baldo Bonds, MSW, LCSW 5/27/20252:02 PM

## 2023-11-03 LAB — CULTURE, BLOOD (ROUTINE X 2)
Culture: NO GROWTH
Special Requests: ADEQUATE

## 2023-11-07 ENCOUNTER — Encounter: Admitting: Student

## 2023-11-07 ENCOUNTER — Encounter: Payer: Self-pay | Admitting: General Practice

## 2023-11-30 ENCOUNTER — Other Ambulatory Visit (HOSPITAL_COMMUNITY): Payer: Self-pay
